# Patient Record
Sex: Female | Born: 1950 | Hispanic: Yes | Marital: Married | Smoking: Never smoker
Health system: Southern US, Community
[De-identification: ages and names within clinical notes are randomized; demographics above are authoritative.]

## PROBLEM LIST (undated history)

## (undated) DIAGNOSIS — Z87898 Personal history of other specified conditions: Secondary | ICD-10-CM

## (undated) DIAGNOSIS — J42 Unspecified chronic bronchitis: Secondary | ICD-10-CM

## (undated) DIAGNOSIS — I10 Essential (primary) hypertension: Secondary | ICD-10-CM

## (undated) HISTORY — DX: Personal history of other specified conditions: Z87.898

## (undated) HISTORY — DX: Unspecified chronic bronchitis: J42

---

## 2015-01-13 ENCOUNTER — Observation Stay (HOSPITAL_COMMUNITY)
Admission: EM | Admit: 2015-01-13 | Discharge: 2015-01-15 | Disposition: A | Discharge status: Home / Self Care | Payer: Self-pay | Attending: Internal Medicine | Admitting: Internal Medicine

## 2015-01-13 ENCOUNTER — Encounter (HOSPITAL_COMMUNITY): Payer: Self-pay | Admitting: *Deleted

## 2015-01-13 ENCOUNTER — Emergency Department (HOSPITAL_COMMUNITY): Payer: Self-pay

## 2015-01-13 DIAGNOSIS — R7989 Other specified abnormal findings of blood chemistry: Secondary | ICD-10-CM

## 2015-01-13 DIAGNOSIS — E279 Disorder of adrenal gland, unspecified: Secondary | ICD-10-CM

## 2015-01-13 DIAGNOSIS — R079 Chest pain, unspecified: Secondary | ICD-10-CM | POA: Insufficient documentation

## 2015-01-13 DIAGNOSIS — Z8249 Family history of ischemic heart disease and other diseases of the circulatory system: Secondary | ICD-10-CM | POA: Insufficient documentation

## 2015-01-13 DIAGNOSIS — J45901 Unspecified asthma with (acute) exacerbation: Principal | ICD-10-CM | POA: Insufficient documentation

## 2015-01-13 DIAGNOSIS — R0600 Dyspnea, unspecified: Secondary | ICD-10-CM | POA: Diagnosis present

## 2015-01-13 DIAGNOSIS — Z79899 Other long term (current) drug therapy: Secondary | ICD-10-CM | POA: Insufficient documentation

## 2015-01-13 DIAGNOSIS — E669 Obesity, unspecified: Secondary | ICD-10-CM | POA: Insufficient documentation

## 2015-01-13 DIAGNOSIS — R748 Abnormal levels of other serum enzymes: Secondary | ICD-10-CM | POA: Insufficient documentation

## 2015-01-13 DIAGNOSIS — R0602 Shortness of breath: Secondary | ICD-10-CM

## 2015-01-13 DIAGNOSIS — I10 Essential (primary) hypertension: Secondary | ICD-10-CM | POA: Insufficient documentation

## 2015-01-13 DIAGNOSIS — E278 Other specified disorders of adrenal gland: Secondary | ICD-10-CM | POA: Insufficient documentation

## 2015-01-13 DIAGNOSIS — R062 Wheezing: Secondary | ICD-10-CM | POA: Diagnosis present

## 2015-01-13 DIAGNOSIS — Z6841 Body Mass Index (BMI) 40.0 and over, adult: Secondary | ICD-10-CM | POA: Insufficient documentation

## 2015-01-13 DIAGNOSIS — I214 Non-ST elevation (NSTEMI) myocardial infarction: Secondary | ICD-10-CM | POA: Diagnosis present

## 2015-01-13 DIAGNOSIS — R778 Other specified abnormalities of plasma proteins: Secondary | ICD-10-CM

## 2015-01-13 HISTORY — DX: Essential (primary) hypertension: I10

## 2015-01-13 LAB — CBC WITH DIFFERENTIAL/PLATELET
BASOS PCT: 1 % (ref 0–1)
Basophils Absolute: 0.1 10*3/uL (ref 0.0–0.1)
EOS ABS: 0.1 10*3/uL (ref 0.0–0.7)
EOS PCT: 1 % (ref 0–5)
HEMATOCRIT: 37.9 % (ref 36.0–46.0)
Hemoglobin: 12.9 g/dL (ref 12.0–15.0)
LYMPHS ABS: 2.2 10*3/uL (ref 0.7–4.0)
LYMPHS PCT: 28 % (ref 12–46)
MCH: 31.8 pg (ref 26.0–34.0)
MCHC: 34 g/dL (ref 30.0–36.0)
MCV: 93.3 fL (ref 78.0–100.0)
Monocytes Absolute: 0.6 10*3/uL (ref 0.1–1.0)
Monocytes Relative: 8 % (ref 3–12)
NEUTROS ABS: 5 10*3/uL (ref 1.7–7.7)
NEUTROS PCT: 62 % (ref 43–77)
Platelets: 184 10*3/uL (ref 150–400)
RBC: 4.06 MIL/uL (ref 3.87–5.11)
RDW: 13.1 % (ref 11.5–15.5)
WBC: 7.9 10*3/uL (ref 4.0–10.5)

## 2015-01-13 LAB — COMPREHENSIVE METABOLIC PANEL
ALK PHOS: 63 U/L (ref 38–126)
ALT: 20 U/L (ref 14–54)
ANION GAP: 11 (ref 5–15)
AST: 25 U/L (ref 15–41)
Albumin: 3.2 g/dL — ABNORMAL LOW (ref 3.5–5.0)
BILIRUBIN TOTAL: 0.5 mg/dL (ref 0.3–1.2)
BUN: 13 mg/dL (ref 6–20)
CALCIUM: 8.9 mg/dL (ref 8.9–10.3)
CHLORIDE: 102 mmol/L (ref 101–111)
CO2: 27 mmol/L (ref 22–32)
CREATININE: 0.71 mg/dL (ref 0.44–1.00)
GFR calc Af Amer: >60 mL/min (ref >60)
GFR calc non Af Amer: >60 mL/min (ref >60)
GLUCOSE: 133 mg/dL — AB (ref 65–99)
POTASSIUM: 3.6 mmol/L (ref 3.5–5.1)
SODIUM: 140 mmol/L (ref 135–145)
TOTAL PROTEIN: 7.5 g/dL (ref 6.5–8.1)

## 2015-01-13 LAB — CBC
HCT: 38.5 % (ref 36.0–46.0)
Hemoglobin: 13.4 g/dL (ref 12.0–15.0)
MCH: 32.7 pg (ref 26.0–34.0)
MCHC: 34.8 g/dL (ref 30.0–36.0)
MCV: 93.9 fL (ref 78.0–100.0)
Platelets: 165 10*3/uL (ref 150–400)
RBC: 4.1 MIL/uL (ref 3.87–5.11)
RDW: 13.1 % (ref 11.5–15.5)
WBC: 6.3 10*3/uL (ref 4.0–10.5)

## 2015-01-13 LAB — HEPARIN LEVEL (UNFRACTIONATED)
HEPARIN UNFRACTIONATED: 0.11 [IU]/mL — AB (ref 0.30–0.70)
HEPARIN UNFRACTIONATED: 0.29 [IU]/mL — AB (ref 0.30–0.70)

## 2015-01-13 LAB — LIPID PANEL
CHOL/HDL RATIO: 3.7 ratio
CHOLESTEROL: 162 mg/dL (ref 0–200)
CHOLESTEROL: 165 mg/dL (ref 0–200)
HDL: 44 mg/dL (ref >40)
HDL: 44 mg/dL (ref >40)
LDL Cholesterol: 102 mg/dL — ABNORMAL HIGH (ref 0–99)
LDL Cholesterol: 106 mg/dL — ABNORMAL HIGH (ref 0–99)
TRIGLYCERIDES: 78 mg/dL (ref <150)
TRIGLYCERIDES: 76 mg/dL (ref <150)
Total CHOL/HDL Ratio: 3.8 RATIO
VLDL: 16 mg/dL (ref 0–40)
VLDL: 15 mg/dL (ref 0–40)

## 2015-01-13 LAB — I-STAT TROPONIN, ED
TROPONIN I, POC: 0.11 ng/mL — AB (ref 0.00–0.08)
TROPONIN I, POC: 0 ng/mL (ref 0.00–0.08)

## 2015-01-13 LAB — D-DIMER, QUANTITATIVE: D-Dimer, Quant: 1.8 ug/mL-FEU — ABNORMAL HIGH (ref 0.00–0.48)

## 2015-01-13 LAB — TSH
TSH: 6.609 u[IU]/mL — ABNORMAL HIGH (ref 0.350–4.500)
TSH: 4.818 u[IU]/mL — ABNORMAL HIGH (ref 0.350–4.500)
TSH: 5.517 u[IU]/mL — ABNORMAL HIGH (ref 0.350–4.500)

## 2015-01-13 LAB — TROPONIN I
Troponin I: <0.03 ng/mL (ref <0.031)
Troponin I: <0.03 ng/mL (ref <0.031)
Troponin I: <0.03 ng/mL (ref <0.031)

## 2015-01-13 LAB — BRAIN NATRIURETIC PEPTIDE
B NATRIURETIC PEPTIDE 5: 59.4 pg/mL (ref 0.0–100.0)
B NATRIURETIC PEPTIDE 5: 82.2 pg/mL (ref 0.0–100.0)

## 2015-01-13 MED ORDER — ASPIRIN 81 MG PO CHEW: 81.0000 mg | CHEWABLE_TABLET | Freq: Every day | ORAL | Status: DC

## 2015-01-13 MED ORDER — ALBUTEROL SULFATE HFA 108 (90 BASE) MCG/ACT IN AERS: 1.0000 | INHALATION_SPRAY | Freq: Four times a day (QID) | RESPIRATORY_TRACT | Status: DC | PRN

## 2015-01-13 MED ORDER — AZITHROMYCIN 250 MG PO TABS
250.0000 mg | ORAL_TABLET | Freq: Every day | ORAL | Status: DC
  Administered 2015-01-14 – 2015-01-15 (×2): 250 mg via ORAL
  Filled 2015-01-13 (×2): qty 1

## 2015-01-13 MED ORDER — ASPIRIN EC 81 MG PO TBEC: 81.0000 mg | DELAYED_RELEASE_TABLET | Freq: Every day | ORAL | Status: DC

## 2015-01-13 MED ORDER — IOHEXOL 350 MG/ML SOLN
100.0000 mL | Freq: Once | INTRAVENOUS | Status: AC | PRN
  Administered 2015-01-13: 100 mL via INTRAVENOUS

## 2015-01-13 MED ORDER — ACETAMINOPHEN 325 MG PO TABS: 650.0000 mg | ORAL_TABLET | Freq: Four times a day (QID) | ORAL | Status: DC | PRN

## 2015-01-13 MED ORDER — AZITHROMYCIN 500 MG PO TABS
500.0000 mg | ORAL_TABLET | Freq: Every day | ORAL | Status: DC
Start: 2015-01-13 — End: 2015-01-13

## 2015-01-13 MED ORDER — HEPARIN SODIUM (PORCINE) 5000 UNIT/ML IJ SOLN: 60.0000 [IU]/kg | Freq: Once | INTRAMUSCULAR | Status: DC

## 2015-01-13 MED ORDER — ACETAMINOPHEN 650 MG RE SUPP: 650.0000 mg | Freq: Four times a day (QID) | RECTAL | Status: DC | PRN

## 2015-01-13 MED ORDER — HEPARIN (PORCINE) IN NACL 100-0.45 UNIT/ML-% IJ SOLN
1000.0000 [IU]/h | INTRAMUSCULAR | Status: DC
  Administered 2015-01-13: 1000 [IU]/h via INTRAVENOUS
  Filled 2015-01-13: qty 250

## 2015-01-13 MED ORDER — IOHEXOL 350 MG/ML SOLN: 80.0000 mL | Freq: Once | INTRAVENOUS | Status: DC | PRN

## 2015-01-13 MED ORDER — HEPARIN BOLUS VIA INFUSION
2000.0000 [IU] | Freq: Once | INTRAVENOUS | Status: AC
  Administered 2015-01-13: 2000 [IU] via INTRAVENOUS
  Filled 2015-01-13: qty 2000

## 2015-01-13 MED ORDER — HEPARIN BOLUS VIA INFUSION
4000.0000 [IU] | Freq: Once | INTRAVENOUS | Status: AC
  Administered 2015-01-13: 4000 [IU] via INTRAVENOUS
  Filled 2015-01-13: qty 4000

## 2015-01-13 MED ORDER — ALBUTEROL SULFATE (2.5 MG/3ML) 0.083% IN NEBU: 2.5000 mg | INHALATION_SOLUTION | Freq: Four times a day (QID) | RESPIRATORY_TRACT | Status: DC | PRN

## 2015-01-13 MED ORDER — PREDNISONE 20 MG PO TABS: 40.0000 mg | ORAL_TABLET | Freq: Every day | ORAL | Status: DC

## 2015-01-13 MED ORDER — AZITHROMYCIN 500 MG PO TABS
500.0000 mg | ORAL_TABLET | Freq: Every day | ORAL | Status: AC
  Administered 2015-01-13: 500 mg via ORAL
  Filled 2015-01-13: qty 1

## 2015-01-13 MED ORDER — ASPIRIN 325 MG PO TABS
325.0000 mg | ORAL_TABLET | Freq: Every day | ORAL | Status: DC
  Administered 2015-01-13 – 2015-01-15 (×3): 325 mg via ORAL
  Filled 2015-01-13 (×3): qty 1

## 2015-01-13 MED ORDER — METOPROLOL TARTRATE 25 MG PO TABS
25.0000 mg | ORAL_TABLET | Freq: Two times a day (BID) | ORAL | Status: DC
  Administered 2015-01-13 – 2015-01-14 (×3): 25 mg via ORAL
  Filled 2015-01-13 (×5): qty 1

## 2015-01-13 MED ORDER — IPRATROPIUM-ALBUTEROL 0.5-2.5 (3) MG/3ML IN SOLN
3.0000 mL | Freq: Once | RESPIRATORY_TRACT | Status: AC
  Administered 2015-01-13: 3 mL via RESPIRATORY_TRACT
  Filled 2015-01-13: qty 3

## 2015-01-13 MED ORDER — GUAIFENESIN-DM 100-10 MG/5ML PO SYRP: 5.0000 mL | ORAL_SOLUTION | ORAL | Status: DC | PRN

## 2015-01-13 MED ORDER — SODIUM CHLORIDE 0.9 % IJ SOLN
3.0000 mL | Freq: Two times a day (BID) | INTRAMUSCULAR | Status: DC
Start: 2015-01-13 — End: 2015-01-15
  Administered 2015-01-14: 3 mL via INTRAVENOUS

## 2015-01-13 MED ORDER — HEPARIN BOLUS VIA INFUSION
1000.0000 [IU] | Freq: Once | INTRAVENOUS | Status: DC
  Filled 2015-01-13: qty 1000

## 2015-01-13 MED ORDER — HEPARIN BOLUS VIA INFUSION
1050.0000 [IU] | Freq: Once | INTRAVENOUS | Status: AC
Start: 2015-01-13 — End: 2015-01-13
  Administered 2015-01-13: 1050 [IU] via INTRAVENOUS
  Filled 2015-01-13: qty 1050

## 2015-01-13 MED ORDER — AZITHROMYCIN 250 MG PO TABS: 250.0000 mg | ORAL_TABLET | Freq: Every day | ORAL | Status: DC

## 2015-01-13 MED ORDER — IPRATROPIUM-ALBUTEROL 0.5-2.5 (3) MG/3ML IN SOLN
3.0000 mL | Freq: Four times a day (QID) | RESPIRATORY_TRACT | Status: AC
  Administered 2015-01-13 – 2015-01-14 (×3): 3 mL via RESPIRATORY_TRACT
  Filled 2015-01-13 (×4): qty 3

## 2015-01-13 MED ORDER — SODIUM CHLORIDE 0.9 % IJ SOLN
3.0000 mL | Freq: Two times a day (BID) | INTRAMUSCULAR | Status: DC
  Administered 2015-01-14 – 2015-01-15 (×2): 3 mL via INTRAVENOUS

## 2015-01-13 MED ORDER — ATORVASTATIN CALCIUM 40 MG PO TABS
40.0000 mg | ORAL_TABLET | Freq: Every day | ORAL | Status: DC
  Administered 2015-01-13 – 2015-01-14 (×2): 40 mg via ORAL
  Filled 2015-01-13 (×3): qty 1

## 2015-01-13 MED ORDER — HEPARIN (PORCINE) IN NACL 100-0.45 UNIT/ML-% IJ SOLN
1350.0000 [IU]/h | INTRAMUSCULAR | Status: DC
  Administered 2015-01-13: 1000 [IU]/h via INTRAVENOUS
  Administered 2015-01-14: 1350 [IU]/h via INTRAVENOUS
  Filled 2015-01-13 (×4): qty 250

## 2015-01-13 MED ORDER — PANTOPRAZOLE SODIUM 40 MG PO TBEC
80.0000 mg | DELAYED_RELEASE_TABLET | Freq: Every day | ORAL | Status: DC
  Administered 2015-01-13 – 2015-01-14 (×2): 80 mg via ORAL
  Filled 2015-01-13 (×2): qty 2

## 2015-01-13 MED ORDER — METHYLPREDNISOLONE SODIUM SUCC 125 MG IJ SOLR
125.0000 mg | Freq: Four times a day (QID) | INTRAMUSCULAR | Status: DC
  Administered 2015-01-13 – 2015-01-15 (×7): 125 mg via INTRAVENOUS
  Filled 2015-01-13 (×11): qty 2

## 2015-01-13 NOTE — H&P (Addendum)
[facility] History and Physical  Virginia Miles NFA:[id number] DOB: [birth year] DOA: 01/13/2015  Referring physician: ED PCP: Does not have a PCP here in the [place]. Visiting family from [place] for a few months.  Chief Complaint:  Shortness of breath for past 2 days  HPI:  64 year old obese female with history of? Chronic bronchitis, hypertension who came to the [place] about 2 weeks back from [place] to visit family presented to the ED with shortness of breath for the past 2 days. Patient reports having acute onset of shortness of breath worsened with activity and then became progressive making her dyspneic at rest associated with orthopnea. Reports having similar symptoms about 10 months back and was hospitalized in [place] and was told that she had bronchitis symptoms. Patient reports having some headache today but denies any blurred vision. She denies any chest pain or palpitation. Denies dizziness, fever, chills, nausea, vomiting, abdominal pain, bowel or urinary symptoms. Denies any leg swellings or pain. Reports her mother having a heart attack in her 40s and also had breast cancer. Denies having any cardiac workup done in the past. Course in the ED Patient had mildly elevated i-STAT troponin in the ED. BNP was normal. D-dimer was elevated as well. CBC and comprehensive metabolic panel were unremarkable except for glucose of 133 and 3.2. EKG done showed normal sinus rhythm without any ST-T changes. Hospitalist admission requested. Cardiology consulted who recommended cycling troponins, placing her on IV heparin and obtaining a 2-D echo.    Review of Systems:  As outlined in history of present illness, Toprol and review of systems otherwise unremarkable   Past Medical History  Diagnosis Date  . Hypertension       Reactivated disease/ bronchitis  History reviewed. No pertinent past surgical history. Social History:  reports that she has never smoked. She does not have any  smokeless tobacco history on file. She reports that she does not drink alcohol. Her drug history is not on file.  No Known Allergies  Family history Mother had heart attack in her 53s and also had breast cancer  Prior to Admission medications   Medication Sig Start Date End Date Taking? Authorizing Provider  albuterol (PROVENTIL HFA;VENTOLIN HFA) 108 (90 BASE) MCG/ACT inhaler Inhale 1-2 puffs into the lungs every 6 (six) hours as needed for wheezing or shortness of breath.   Yes Historical Provider, MD  omeprazole (PRILOSEC) 40 MG capsule Take 40 mg by mouth 2 (two) times daily.   Yes Historical Provider, MD     Physical Exam:  Filed Vitals:   01/13/15 0600 01/13/15 0630 01/13/15 0700 01/13/15 0730  BP: 128/69 142/68 145/59 134/67  Pulse: 80 68 60 83  Temp:      Resp: Height:      Weight:      SpO2: 100% 97% 96% 99%    Constitutional: Vital signs reviewed.  Elderly obese female in no acute distress HEENT: no pallor, no icterus, moist oral mucosa, no cervical lymphadenopathy, no JVD Cardiovascular: RRR, S1 normal, S2 normal, no MRG Chest: CTAB, no wheezes, rales, or rhonchi Abdominal: Soft. Non-tender, non-distended, bowel sounds are normal,  Ext: warm, no edema Neurological: Alert and oriented, nonfocal  Labs on Admission:  Basic Metabolic Panel:  Recent Labs Lab 01/13/15 0051  NA 140  K 3.6  CL 102  CO2 27  GLUCOSE 133*  BUN 13  CREATININE 0.71  CALCIUM 8.9   Liver Function Tests:  Recent Labs Lab 01/13/15 0051  AST 25  ALT 20  ALKPHOS 63  BILITOT 0.5  PROT 7.5  ALBUMIN 3.2*   No results for input(s): LIPASE, AMYLASE in the last 168 hours. No results for input(s): AMMONIA in the last 168 hours. CBC:  Recent Labs Lab 01/13/15 0051  WBC 7.9  NEUTROABS 5.0  HGB 12.9  HCT 37.9  MCV 93.3  PLT 184   Cardiac Enzymes:  Recent Labs Lab 01/13/15 0240  TROPONINI <0.03   BNP: Invalid input(s): POCBNP CBG: No results for input(s):  GLUCAP in the last 168 hours.  Radiological Exams on Admission: Dg Chest 2 View  01/13/2015   CLINICAL DATA:  Shortness of breath and cough  EXAM: CHEST  2 VIEW  COMPARISON:  None.  FINDINGS: There is no edema or consolidation. Heart size and pulmonary vascularity are normal. No adenopathy. There is degenerative change in the thoracic spine.  IMPRESSION: No edema or consolidation.   Electronically Signed   By: Bretta Bang III M.D.   On: 01/13/2015 02:38   Ct Angio Chest Pe W/cm &/or Wo Cm  01/13/2015   CLINICAL DATA:  Shortness of breath with exertion. History of hypertension. Assess for pulmonary embolism.  EXAM: CT ANGIOGRAPHY CHEST WITH CONTRAST  TECHNIQUE: Multidetector CT imaging of the chest was performed using the standard protocol during bolus administration of intravenous contrast. Multiplanar CT image reconstructions and MIPs were obtained to evaluate the vascular anatomy.  CONTRAST:  OMNIPAQUE IOHEXOL 350 MG/ML SOLN  COMPARISON:  Chest radiograph January 13, 2015 at 1:29 a.m.  FINDINGS: PULMONARY ARTERY: Adequate contrast opacification of the pulmonary artery's. Main pulmonary artery is not enlarged. No pulmonary arterial filling defects to the level of the subsegmental branches.  MEDIASTINUM: Heart is mildly enlarged, no RIGHT heart strain. No pericardial effusions. Delete the Thoracic aorta is normal course and caliber, 2 vessel arch is a normal variant, mild calcific atherosclerosis. RIGHT hilar lymphadenopathy measures up to 12 mm short axis, smaller LEFT hilar lymph nodes. LEFT peritracheal 11 mm short axis lymph node.  LUNGS: Tracheobronchial tree is patent, no pneumothorax. Pulmonary venous congestion. No pleural effusions, focal consolidations, pulmonary nodules or masses. Mild heterogeneous lung attenuation.  SOFT TISSUES AND OSSEOUS STRUCTURES: 16 x 15 mm LEFT adrenal nodule, 41 Hounsfield units. Visualized soft tissues and included osseous structures appear normal.  Review of the  MIP images confirms the above findings.  IMPRESSION: No acute pulmonary embolism.  Mild cardiomegaly and pulmonary venous congestion.  Heterogeneous lung attenuation can be seen with small airway disease or pulmonary edema without focal consolidation.  15 x 16 mm LEFT adrenal nodule: Recommend follow-up CT versus MRI and 1 year.   Electronically Signed   By: Awilda Metro M.D.   On: 01/13/2015 05:48    EKG: Normal sinus rhythm at 91, no ST-T changes  Assessment/Plan Principal Problem:   NSTEMI (non-ST elevated myocardial infarction) -Patient is stable to be admitted on telemetry. Patient has mildly elevated troponin in the ED. Started on IV heparin drip. Will order full dose aspirin. Check lipid panel and 2-D echo. Will place her on low-dose metoprolol. Cycle troponins. Based on history and exam my suspicion of ACS is quite low. -Cardiology following.  Active Problems:   Dyspnea secondary to Reactive airway disease with acute exacerbation Patient reports history of bronchitis and having similar symptoms about 10 months back in [place]. Reportedly was wheezy upon presentation. Also has nonproductive cough. Possibly has acute bronchitis symptoms. No signs of volume overload. Check BNP. -I will place  her on a course of Z-Pak, or prednisone and when necessary albuterol nebs.      Essential hypertension On Benicar at home. Does not remember the dose. Monitor on beta blocker for now.  Incidental finding of left adrenal nodule 1516 mm on CT scan No Constitutional  symptoms. Recommend follow-up CT versus MRI in one year.      Obesity Needs counseling on diet monitoring and weight loss. Check lipid panel and A1c.    Diet:cardiac  DVT prophylaxis: IV heparin   Code Status: Full code Family Communication: discussed with patient's son and husband bedside Disposition Plan: Admit to stepdown  Eddie North [facility] Pager [phone]  Total time spent on admission :50  minutes  If 7PM-7AM, please contact night-coverage [email or web address] Password [facility] 01/13/2015, 7:44 AM

## 2015-01-13 NOTE — Progress Notes (Signed)
ANTICOAGULATION CONSULT NOTE - Follow-up  Pharmacy Consult for heparin Indication: chest pain/ACS  No Known Allergies  Patient Measurements: Height: 5' 0.24" (153 cm) Weight: 224 lb (101.606 kg) IBW/kg (Calculated) : 46.04 Heparin Dosing Weight: 70.8 kg  Vital Signs: BP: 134/70 mmHg (08/08 1300) Pulse Rate: 71 (08/08 1300)  Labs:  Recent Labs  01/13/15 0051 01/13/15 0240 01/13/15 1223  HGB 12.9  --  13.4  HCT 37.9  --  38.5  PLT 184  --  165  HEPARINUNFRC  --   --  0.11*  CREATININE 0.71  --   --   TROPONINI  --  <0.03  --     Estimated Creatinine Clearance: 77.5 mL/min (by C-G formula based on Cr of 0.71).  Assessment: 64 yo F in ED with CC of SOB. She continues on IV heparin. CT negative for PE. Initial heparin level is subtherapeutic. No bleeding noted.   Goal of Therapy:  Heparin level 0.3-0.7 units/ml Monitor platelets by anticoagulation protocol: Yes   Plan:  - Heparin bolus 2000 units IV x 1 - Increase heparin gtt to 1200 units/hr - Check a 6 hour heparin level - Continue daily heparin level and CBC  Lysle Pearl, PharmD, BCPS Pager # [phone] 01/13/2015 1:44 PM

## 2015-01-13 NOTE — ED Provider Notes (Signed)
CSN: [id number]     Arrival date & time 01/13/15  0017 History   First MD Initiated Contact with Patient 01/13/15 [phone]     Chief Complaint  Patient presents with  . Shortness of Breath     (Consider location/radiation/quality/duration/timing/severity/associated sxs/prior Treatment) HPI Comments: Patient is a 64 year old female with a past medical history of hypertension who presents with SOB that started earlier this morning. Symptoms started gradually and remained constant since the onset. The SOB is present at rest and is worsened with exertion. Patient reports associated epigastric "fullness" that started around the same time. She also reports associated non productive cough. She denies chest pain, fever, leg swelling. No recent travel/surgery/immobilization, exogenous estrogen, previous PE/DVT.    Past Medical History  Diagnosis Date  . Hypertension    History reviewed. No pertinent past surgical history. No family history on file. History  Substance Use Topics  . Smoking status: Never Smoker   . Smokeless tobacco: Not on file  . Alcohol Use: No   OB History    No data available     Review of Systems  Respiratory: Positive for cough and shortness of breath.   All other systems reviewed and are negative.     Allergies  Review of patient's allergies indicates no known allergies.  Home Medications   Prior to Admission medications   Medication Sig Start Date End Date Taking? Authorizing Provider  albuterol (PROVENTIL HFA;VENTOLIN HFA) 108 (90 BASE) MCG/ACT inhaler Inhale 1-2 puffs into the lungs every 6 (six) hours as needed for wheezing or shortness of breath.   Yes Historical Provider, MD  omeprazole (PRILOSEC) 40 MG capsule Take 40 mg by mouth 2 (two) times daily.   Yes Historical Provider, MD   BP 123/47 mmHg  Pulse 81  Temp(Src) 99.2 F (37.3 C)  Resp 20  SpO2 99% Physical Exam  Constitutional: She is oriented to person, place, and time. She appears  well-developed and well-nourished. No distress.  HENT:  Head: Normocephalic and atraumatic.  Eyes: Conjunctivae and EOM are normal.  Neck: Normal range of motion.  Cardiovascular: Normal rate and regular rhythm.  Exam reveals no gallop and no friction rub.   No murmur heard. No lower extremity swelling or calf tenderness to palpation.   Pulmonary/Chest: Effort normal and breath sounds normal. She has no wheezes. She has no rales. She exhibits no tenderness.  Mild expiratory wheezes at bilateral apices  Abdominal: Soft. She exhibits no distension. There is no tenderness. There is no rebound.  Musculoskeletal: Normal range of motion.  Neurological: She is alert and oriented to person, place, and time. Coordination normal.  Speech is goal-oriented. Moves limbs without ataxia.   Skin: Skin is warm and dry.  Psychiatric: She has a normal mood and affect. Her behavior is normal.  Nursing note and vitals reviewed.   ED Course  Procedures (including critical care time)  CRITICAL CARE Performed by: Emilia Beck   Total critical care time: 35 min  Critical care time was exclusive of separately billable procedures and treating other patients.  Critical care was necessary to treat or prevent imminent or life-threatening deterioration.  Critical care was time spent personally by me on the following activities: development of treatment plan with patient and/or surrogate as well as nursing, discussions with consultants, evaluation of patient's response to treatment, examination of patient, obtaining history from patient or surrogate, ordering and performing treatments and interventions, ordering and review of laboratory studies, ordering and review of radiographic studies,  pulse oximetry and re-evaluation of patient's condition.   Labs Review Labs Reviewed  COMPREHENSIVE METABOLIC PANEL - Abnormal; Notable for the following:    Glucose, Bld 133 (*)    Albumin 3.2 (*)    All other  components within normal limits  D-DIMER, QUANTITATIVE (NOT AT [facility]) - Abnormal; Notable for the following:    D-Dimer, Quant 1.80 (*)    All other components within normal limits  HEPARIN LEVEL (UNFRACTIONATED) - Abnormal; Notable for the following:    Heparin Unfractionated 0.11 (*)    All other components within normal limits  LIPID PANEL - Abnormal; Notable for the following:    LDL Cholesterol 102 (*)    All other components within normal limits  TSH - Abnormal; Notable for the following:    TSH 6.609 (*)    All other components within normal limits  TSH - Abnormal; Notable for the following:    TSH 5.517 (*)    All other components within normal limits  LIPID PANEL - Abnormal; Notable for the following:    LDL Cholesterol 106 (*)    All other components within normal limits  TSH - Abnormal; Notable for the following:    TSH 4.818 (*)    All other components within normal limits  I-STAT TROPOININ, ED - Abnormal; Notable for the following:    Troponin i, poc 0.11 (*)    All other components within normal limits  CBC WITH DIFFERENTIAL/PLATELET  BRAIN NATRIURETIC PEPTIDE  TROPONIN I  CBC  TROPONIN I  BRAIN NATRIURETIC PEPTIDE  HEMOGLOBIN A1C  HEPARIN LEVEL (UNFRACTIONATED)  TROPONIN I  TROPONIN I  TROPONIN I  HEPARIN LEVEL (UNFRACTIONATED)  CBC  BASIC METABOLIC PANEL  PROTIME-INR  I-STAT TROPOININ, ED    Imaging Review No results found.   EKG Interpretation   Date/Time:  Monday January 13 2015 00:24:17 EDT Ventricular Rate:  91 PR Interval:  158 QRS Duration: 88 QT Interval:  362 QTC Calculation: 445 R Axis:   65 Text Interpretation:  Normal sinus rhythm Normal ECG No prior for  comparison Confirmed by Gwendolyn Grant  MD, BLAIR ([id number]) on 01/13/2015 2:22:15 AM      MDM   Final diagnoses:  SOB (shortness of breath)  Elevated troponin  NSTEMI (non-ST elevated myocardial infarction)    2:31 AM Patient's vitals stable and patient afebrile. EKG shows no acute  changes. Troponin pending. Chest xray pending. Patient will have duoneb for mild wheezing to see if this improves her symptoms.  Patient's troponin mildly elevated. Chest xray unremarkable. EKG shows no acute changes. Dr. Gwendolyn Grant saw the patient and spoke to the Cardiology fellow who will see the patient.   Emilia Beck, PA-C 01/13/15 2031  Elwin Mocha, MD 01/13/15 2352

## 2015-01-13 NOTE — Progress Notes (Addendum)
Subjective: No chest pain only upper abd discomfort.    Objective: Vital signs in last 24 hours: Temp:  [99.2 F (37.3 C)] 99.2 F (37.3 C) (08/08 0026) Pulse Rate:  [57-92] 68 (08/08 1400) Resp:  [14-23] 17 (08/08 1400) BP: (117-153)/(47-75) 117/56 mmHg (08/08 1400) SpO2:  [95 %-100 %] 100 % (08/08 1400) Weight:  [224 lb (101.606 kg)] 224 lb (101.606 kg) (08/08 0349) Weight change:    Intake/Output from previous day:   Intake/Output this shift:    PE: General:Pleasant affect, NAD Skin:Warm and dry, brisk capillary refill HEENT:normocephalic, sclera clear, mucus membranes moist Heart:S1S2 RRR without murmur, gallup, rub or click Lungs: without rales, rhonchi, + wheezes GNF:AOZH, non tender, + BS, do not palpate liver spleen or masses Ext:no lower ext edema, 2+ pedal pulses, 2+ radial pulses Neuro:alert and oriented X 3, MAE, follows commands, + facial symmetry  tele:  SR   Lab Results:  Recent Labs  01/13/15 0051 01/13/15 1223  WBC 7.9 6.3  HGB 12.9 13.4  HCT 37.9 38.5  PLT 184 165   BMET  Recent Labs  01/13/15 0051  NA 140  K 3.6  CL 102  CO2 27  GLUCOSE 133*  BUN 13  CREATININE 0.71  CALCIUM 8.9    Recent Labs  01/13/15 0240  TROPONINI <0.03    Lab Results  Component Value Date   CHOL 162 01/13/2015   HDL 44 01/13/2015   LDLCALC 102* 01/13/2015   TRIG 78 01/13/2015   CHOLHDL 3.7 01/13/2015   No results found for: HGBA1C   Lab Results  Component Value Date   TSH 6.609* 01/13/2015    Hepatic Function Panel  Recent Labs  01/13/15 0051  PROT 7.5  ALBUMIN 3.2*  AST 25  ALT 20  ALKPHOS 63  BILITOT 0.5    Recent Labs  01/13/15 0838  CHOL 162   No results for input(s): PROTIME in the last 72 hours.     Studies/Results: Dg Chest 2 View  01/13/2015   CLINICAL DATA:  Shortness of breath and cough  EXAM: CHEST  2 VIEW  COMPARISON:  None.  FINDINGS: There is no edema or consolidation. Heart size and pulmonary  vascularity are normal. No adenopathy. There is degenerative change in the thoracic spine.  IMPRESSION: No edema or consolidation.   Electronically Signed   By: Bretta Bang III M.D.   On: 01/13/2015 02:38   Ct Angio Chest Pe W/cm &/or Wo Cm  01/13/2015   CLINICAL DATA:  Shortness of breath with exertion. History of hypertension. Assess for pulmonary embolism.  EXAM: CT ANGIOGRAPHY CHEST WITH CONTRAST  TECHNIQUE: Multidetector CT imaging of the chest was performed using the standard protocol during bolus administration of intravenous contrast. Multiplanar CT image reconstructions and MIPs were obtained to evaluate the vascular anatomy.  CONTRAST:  OMNIPAQUE IOHEXOL 350 MG/ML SOLN  COMPARISON:  Chest radiograph January 13, 2015 at 1:29 a.m.  FINDINGS: PULMONARY ARTERY: Adequate contrast opacification of the pulmonary artery's. Main pulmonary artery is not enlarged. No pulmonary arterial filling defects to the level of the subsegmental branches.  MEDIASTINUM: Heart is mildly enlarged, no RIGHT heart strain. No pericardial effusions. Delete the Thoracic aorta is normal course and caliber, 2 vessel arch is a normal variant, mild calcific atherosclerosis. RIGHT hilar lymphadenopathy measures up to 12 mm short axis, smaller LEFT hilar lymph nodes. LEFT peritracheal 11 mm short axis lymph node.  LUNGS: Tracheobronchial tree is patent, no  pneumothorax. Pulmonary venous congestion. No pleural effusions, focal consolidations, pulmonary nodules or masses. Mild heterogeneous lung attenuation.  SOFT TISSUES AND OSSEOUS STRUCTURES: 16 x 15 mm LEFT adrenal nodule, 41 Hounsfield units. Visualized soft tissues and included osseous structures appear normal.  Review of the MIP images confirms the above findings.  IMPRESSION: No acute pulmonary embolism.  Mild cardiomegaly and pulmonary venous congestion.  Heterogeneous lung attenuation can be seen with small airway disease or pulmonary edema without focal consolidation.  15  x 16 mm LEFT adrenal nodule: Recommend follow-up CT versus MRI and 1 year.   Electronically Signed   By: Awilda Metro M.D.   On: 01/13/2015 05:48    Medications: I have reviewed the patient's current medications. Scheduled Meds: . aspirin  325 mg Oral Daily   Continuous Infusions: . heparin 1,200 Units/hr (01/13/15 1344)   PRN Meds:.  Assessment/Plan: Principal Problem:   NSTEMI (non-ST elevated myocardial infarction) Active Problems:   Wheezing   Reactive airway disease with acute exacerbation   Dyspnea   Essential hypertension   Obesity  Only one troponin is elevated, ? Erroneous- follow up not yet done.  On IV heparin, plan for echo- not yet done.  Her pain is upper abd paiin .  Neg PE on CTA- no mention of calcified coronary arteries on CT. Pt's family in room to interpret .    LOS: 0 days   Time spent with pt. :10 minutes. [facility]  Nurse Practitioner Certified Pager [phone] or after 5pm and on weekends call [phone] 01/13/2015, 3:09 PM  Patient seen on  afternoon rounds.  Currently she is pain-free and comfortable.  Exam reveals clear lungs.  Heart reveals no murmur gallop rub or click.  Follow-up troponins are pending.  EKG personally reviewed.  Shows no ischemic changes.

## 2015-01-13 NOTE — ED Notes (Signed)
The pt is c/o sob since yesterday no pain anywhere.  Non-productive cough no sob at present .  Just feeling tired

## 2015-01-13 NOTE — H&P (Signed)
CARDIOLOGY INPATIENT HISTORY AND PHYSICAL EXAMINATION NOTE  Patient ID: Virginia Miles MRN: [id number], DOB/AGE: 1951-03-09   Admit date: 01/13/2015   Primary Physician: No PCP Per Patient Primary Cardiologist: none  Reason for admission: elevated troponin with SOB  HPI: This is a 64 y.o.Hispanic/spanish speaking female with no known history of CAD and risk factors (hypertension), significant family history of CAD who presented with SOB. Patient was in his usual state of health until 2 days ago when she started having swelling in the legs and has started having orthopnea. She also has DOE. She denied any chest pain. She can walk 10 feet and then gets SOB. She has been having some palpitations since the last 2 months. She lives with her husband. She was born in [place]. She moved from outside [place] 2 weeks ago. She is visiting her son.   She is being admitted for elevated troponin. In the ED, she was found to have elevated D-dimer and a CTA was performed which is pending at the moment of this history.   Problem List: Past Medical History  Diagnosis Date  . Hypertension     History reviewed. No pertinent past surgical history.   Allergies: No Known Allergies   Home Medications Current Facility-Administered Medications  Medication Dose Route Frequency Provider Last Rate Last Dose  . heparin ADULT infusion 100 units/mL (25000 units/250 mL)  1,000 Units/hr Intravenous Continuous Herby Abraham, [facility]   Stopped at 01/13/15 4098   Current Outpatient Prescriptions  Medication Sig Dispense Refill  . albuterol (PROVENTIL HFA;VENTOLIN HFA) 108 (90 BASE) MCG/ACT inhaler Inhale 1-2 puffs into the lungs every 6 (six) hours as needed for wheezing or shortness of breath.    Marland Kitchen omeprazole (PRILOSEC) 40 MG capsule Take 40 mg by mouth 2 (two) times daily.       No family history on file.   History   Social History  . Marital Status: Married    Spouse Name: N/A  . Number of Children: N/A  .  Years of Education: N/A   Occupational History  . Not on file.   Social History Main Topics  . Smoking status: Never Smoker   . Smokeless tobacco: Not on file  . Alcohol Use: No  . Drug Use: Not on file  . Sexual Activity: Not on file   Other Topics Concern  . Not on file   Social History Narrative  . No narrative on file     Review of Systems: General: negative for chills, fever, night sweats or weight changes.  Cardiovascular: dyspnea on exertion, dyspnea and palpitations, orthopnea and PND Dermatological: negative for rash Respiratory: dry cough  Urologic: negative for hematuria Abdominal: negative for nausea, vomiting, diarrhea, bright red blood per rectum, melena, or hematemesis Neurologic: negative for visual changes, syncope, or dizziness Endocrine: no diabetes, no hypothyroidism Immunological: no lymph adenopathy Psych: non homicidal/suicidal  Physical Exam: Vitals: BP 124/66 mmHg  Pulse 63  Temp(Src) 99.2 F (37.3 C)  Resp 15  Ht 5' 0.24" (1.53 m)  Wt 101.606 kg (224 lb)  BMI 43.40 kg/m2  SpO2 97% General: not in acute distress Neck: JVP flat, neck supple Heart: regular rate and rhythm, S1, S2, no murmurs  Lungs: wheezing GI: non tender, non distended, bowel sounds present Extremities: no edema Neuro: AAO x 3  Psych: normal affect, no anxiety   Labs:   Results for orders placed or performed during the hospital encounter of 01/13/15 (from the past 24 hour(s))  CBC with Differential  Status: None   Collection Time: 01/13/15 12:51 AM  Result Value Ref Range   WBC 7.9 4.0 - 10.5 K/uL   RBC 4.06 3.87 - 5.11 MIL/uL   Hemoglobin 12.9 12.0 - 15.0 g/dL   HCT 16.1 09.6 - 04.5 %   MCV 93.3 78.0 - 100.0 fL   MCH 31.8 26.0 - 34.0 pg   MCHC 34.0 30.0 - 36.0 g/dL   RDW 40.9 81.1 - 91.4 %   Platelets 184 150 - 400 K/uL   Neutrophils Relative % 62 43 - 77 %   Neutro Abs 5.0 1.7 - 7.7 K/uL   Lymphocytes Relative 28 12 - 46 %   Lymphs Abs 2.2 0.7 - 4.0  K/uL   Monocytes Relative 8 3 - 12 %   Monocytes Absolute 0.6 0.1 - 1.0 K/uL   Eosinophils Relative 1 0 - 5 %   Eosinophils Absolute 0.1 0.0 - 0.7 K/uL   Basophils Relative 1 0 - 1 %   Basophils Absolute 0.1 0.0 - 0.1 K/uL  Comprehensive metabolic panel     Status: Abnormal   Collection Time: 01/13/15 12:51 AM  Result Value Ref Range   Sodium 140 135 - 145 mmol/L   Potassium 3.6 3.5 - 5.1 mmol/L   Chloride 102 101 - 111 mmol/L   CO2 27 22 - 32 mmol/L   Glucose, Bld 133 (H) 65 - 99 mg/dL   BUN 13 6 - 20 mg/dL   Creatinine, Ser 7.82 0.44 - 1.00 mg/dL   Calcium 8.9 8.9 - 95.6 mg/dL   Total Protein 7.5 6.5 - 8.1 g/dL   Albumin 3.2 (L) 3.5 - 5.0 g/dL   AST 25 15 - 41 U/L   ALT 20 14 - 54 U/L   Alkaline Phosphatase 63 38 - 126 U/L   Total Bilirubin 0.5 0.3 - 1.2 mg/dL   GFR calc non Af Amer >60 >60 mL/min   GFR calc Af Amer >60 >60 mL/min   Anion gap 11 5 - 15  Brain natriuretic peptide     Status: None   Collection Time: 01/13/15 12:51 AM  Result Value Ref Range   B Natriuretic Peptide 59.4 0.0 - 100.0 pg/mL  D-dimer, quantitative (not at [facility])     Status: Abnormal   Collection Time: 01/13/15 12:51 AM  Result Value Ref Range   D-Dimer, Quant 1.80 (H) 0.00 - 0.48 ug/mL-FEU  Troponin I     Status: None   Collection Time: 01/13/15  2:40 AM  Result Value Ref Range   Troponin I <0.03 <0.031 ng/mL  I-stat troponin, ED     Status: Abnormal   Collection Time: 01/13/15  2:59 AM  Result Value Ref Range   Troponin i, poc 0.11 (HH) 0.00 - 0.08 ng/mL   Comment NOTIFIED PHYSICIAN    Comment 3             Radiology/Studies: Dg Chest 2 View  01/13/2015   CLINICAL DATA:  Shortness of breath and cough  EXAM: CHEST  2 VIEW  COMPARISON:  None.  FINDINGS: There is no edema or consolidation. Heart size and pulmonary vascularity are normal. No adenopathy. There is degenerative change in the thoracic spine.  IMPRESSION: No edema or consolidation.   Electronically Signed   By: Bretta Bang III  M.D.   On: 01/13/2015 02:38   Ct Angio Chest Pe W/cm &/or Wo Cm  01/13/2015   CLINICAL DATA:  Shortness of breath with exertion. History of hypertension. Assess for  pulmonary embolism.  EXAM: CT ANGIOGRAPHY CHEST WITH CONTRAST  TECHNIQUE: Multidetector CT imaging of the chest was performed using the standard protocol during bolus administration of intravenous contrast. Multiplanar CT image reconstructions and MIPs were obtained to evaluate the vascular anatomy.  CONTRAST:  OMNIPAQUE IOHEXOL 350 MG/ML SOLN  COMPARISON:  Chest radiograph January 13, 2015 at 1:29 a.m.  FINDINGS: PULMONARY ARTERY: Adequate contrast opacification of the pulmonary artery's. Main pulmonary artery is not enlarged. No pulmonary arterial filling defects to the level of the subsegmental branches.  MEDIASTINUM: Heart is mildly enlarged, no RIGHT heart strain. No pericardial effusions. Delete the Thoracic aorta is normal course and caliber, 2 vessel arch is a normal variant, mild calcific atherosclerosis. RIGHT hilar lymphadenopathy measures up to 12 mm short axis, smaller LEFT hilar lymph nodes. LEFT peritracheal 11 mm short axis lymph node.  LUNGS: Tracheobronchial tree is patent, no pneumothorax. Pulmonary venous congestion. No pleural effusions, focal consolidations, pulmonary nodules or masses. Mild heterogeneous lung attenuation.  SOFT TISSUES AND OSSEOUS STRUCTURES: 16 x 15 mm LEFT adrenal nodule, 41 Hounsfield units. Visualized soft tissues and included osseous structures appear normal.  Review of the MIP images confirms the above findings.  IMPRESSION: No acute pulmonary embolism.  Mild cardiomegaly and pulmonary venous congestion.  Heterogeneous lung attenuation can be seen with small airway disease or pulmonary edema without focal consolidation.  15 x 16 mm LEFT adrenal nodule: Recommend follow-up CT versus MRI and 1 year.   Electronically Signed   By: Awilda Metro M.D.   On: 01/13/2015 05:48    EKG: normal sinus rhythm,  no ST/T wave changes Echo: none available  Cardiac cath: NA  Medical decision making:  Discussed care with the patient Discussed care with the physician on the phone Reviewed labs and imaging personally Reviewed prior records  ASSESSMENT AND PLAN:  This is a 64 y.o. female 64 y.o.Hispanic/spanish speaking female with no known history of CAD and risk factors (hypertension), significant family history of CAD who presented with SOB, dry cough and recent bronchitis attack who is [place] from [place]. She has history of bronchitis almost every year since the last 2-3 years.    Active Problems:   NSTEMI (non-ST elevated myocardial infarction)   Wheezing   Reactive airway disease with acute exacerbation   Dyspnea  Recs: IV heparin drip Cycle troponin Obtain echocardiogram Treat mild asthma exacerbation with steroids, nebs and oxygen Azithromycin dose pack    Signed, Joellyn Rued, MD MS 01/13/2015, 5:54 AM

## 2015-01-13 NOTE — Progress Notes (Signed)
ANTICOAGULATION CONSULT NOTE - Initial Consult  Pharmacy Consult for heparin Indication: chest pain/ACS  No Known Allergies  Patient Measurements: Height: 5' 0.24" (153 cm) Weight: 224 lb (101.606 kg) IBW/kg (Calculated) : 46.04 Heparin Dosing Weight: 70.8 kg  Vital Signs: Temp: 99.2 F (37.3 C) (08/08 0026) BP: 139/71 mmHg (08/08 0330) Pulse Rate: 76 (08/08 0330)  Labs:  Recent Labs  01/13/15 0051  HGB 12.9  HCT 37.9  PLT 184  CREATININE 0.71    Estimated Creatinine Clearance: 77.5 mL/min (by C-G formula based on Cr of 0.71).   Medical History: Past Medical History  Diagnosis Date  . Hypertension      Assessment: 64 yo F in ED with CC of SOB.  Pharmacy consulted to dose heparin for ACS/STEMI.  EKG with no acute changes. First troponin 0.11.  CBC WNL.  Per pt report ht = 153 cm and wt 224 lbs.  Creat 0.71   Goal of Therapy:  Heparin level 0.3-0.7 units/ml Monitor platelets by anticoagulation protocol: Yes   Plan:  Give 4000 units bolus x 1 Start heparin infusion at 1000 units/hr Check anti-Xa level in 6 hours and daily while on heparin Continue to monitor H&H and platelets  Herby Abraham, Pharm.D. [phone] 01/13/2015 3:55 AM

## 2015-01-13 NOTE — ED Notes (Signed)
Patient denies pain and is resting comfortably.  

## 2015-01-13 NOTE — ED Notes (Signed)
MD at bedside. 

## 2015-01-13 NOTE — ED Notes (Signed)
Patient transported to CT 

## 2015-01-13 NOTE — Progress Notes (Signed)
ANTICOAGULATION CONSULT NOTE - Follow-up  Pharmacy Consult for heparin Indication: chest pain/ACS  No Known Allergies  Patient Measurements: Height:  (149.9 cm) Weight: 221 lb 12.5 oz (100.6 kg) IBW/kg (Calculated) : 43.2 Heparin Dosing Weight: 70.8 kg  Vital Signs: Temp: 98.2 F (36.8 C) (08/08 2054) Temp Source: Oral (08/08 2054) BP: 135/63 mmHg (08/08 2054) Pulse Rate: 67 (08/08 2054)  Labs:  Recent Labs  01/13/15 0051 01/13/15 0240 01/13/15 1223 01/13/15 1631 01/13/15 2045  HGB 12.9  --  13.4  --   --   HCT 37.9  --  38.5  --   --   PLT 184  --  165  --   --   HEPARINUNFRC  --   --  0.11*  --  0.29*  CREATININE 0.71  --   --   --   --   TROPONINI  --  <0.03  --  <0.03  --     Estimated Creatinine Clearance: 75.2 mL/min (by C-G formula based on Cr of 0.71).  Assessment: 64 yo F with CC of SOB. She continues on IV heparin. CT negative for PE. ECHO is pending. Heparin level is still subtherapeutic at 0.29. No bleeding noted.   Goal of Therapy:  Heparin level 0.3-0.7 units/ml Monitor platelets by anticoagulation protocol: Yes   Plan:  - Heparin bolus 1050 units IV x 1 - Increase heparin gtt to 1350 units/hr - Check a 6 hour heparin level - Continue daily heparin level and CBC  Juanita Craver, PharmD, BCPS Clinical Pharmacist [phone]

## 2015-01-14 ENCOUNTER — Ambulatory Visit (HOSPITAL_COMMUNITY): Payer: Self-pay | Attending: Internal Medicine

## 2015-01-14 DIAGNOSIS — I351 Nonrheumatic aortic (valve) insufficiency: Secondary | ICD-10-CM | POA: Insufficient documentation

## 2015-01-14 DIAGNOSIS — R0600 Dyspnea, unspecified: Secondary | ICD-10-CM

## 2015-01-14 DIAGNOSIS — I10 Essential (primary) hypertension: Secondary | ICD-10-CM | POA: Insufficient documentation

## 2015-01-14 DIAGNOSIS — R778 Other specified abnormalities of plasma proteins: Secondary | ICD-10-CM | POA: Insufficient documentation

## 2015-01-14 DIAGNOSIS — I071 Rheumatic tricuspid insufficiency: Secondary | ICD-10-CM | POA: Insufficient documentation

## 2015-01-14 DIAGNOSIS — R7989 Other specified abnormal findings of blood chemistry: Secondary | ICD-10-CM

## 2015-01-14 DIAGNOSIS — E669 Obesity, unspecified: Secondary | ICD-10-CM

## 2015-01-14 LAB — BASIC METABOLIC PANEL
ANION GAP: 8 (ref 5–15)
BUN: 14 mg/dL (ref 6–20)
CO2: 25 mmol/L (ref 22–32)
Calcium: 8.6 mg/dL — ABNORMAL LOW (ref 8.9–10.3)
Chloride: 104 mmol/L (ref 101–111)
Creatinine, Ser: 0.73 mg/dL (ref 0.44–1.00)
GFR calc non Af Amer: >60 mL/min (ref >60)
GLUCOSE: 177 mg/dL — AB (ref 65–99)
POTASSIUM: 3.6 mmol/L (ref 3.5–5.1)
Sodium: 137 mmol/L (ref 135–145)

## 2015-01-14 LAB — CBC
HEMATOCRIT: 37.1 % (ref 36.0–46.0)
Hemoglobin: 13.1 g/dL (ref 12.0–15.0)
MCH: 32.7 pg (ref 26.0–34.0)
MCHC: 35.3 g/dL (ref 30.0–36.0)
MCV: 92.5 fL (ref 78.0–100.0)
PLATELETS: 158 10*3/uL (ref 150–400)
RBC: 4.01 MIL/uL (ref 3.87–5.11)
RDW: 13 % (ref 11.5–15.5)
WBC: 6.1 10*3/uL (ref 4.0–10.5)

## 2015-01-14 LAB — TROPONIN I: Troponin I: <0.03 ng/mL (ref <0.031)

## 2015-01-14 LAB — HEMOGLOBIN A1C
HEMOGLOBIN A1C: 5.8 % — AB (ref 4.8–5.6)
MEAN PLASMA GLUCOSE: 120 mg/dL

## 2015-01-14 LAB — HEPARIN LEVEL (UNFRACTIONATED)
HEPARIN UNFRACTIONATED: 0.47 [IU]/mL (ref 0.30–0.70)
Heparin Unfractionated: 0.52 IU/mL (ref 0.30–0.70)

## 2015-01-14 LAB — PROTIME-INR
INR: 1.17 (ref 0.00–1.49)
Prothrombin Time: 15.1 seconds (ref 11.6–15.2)

## 2015-01-14 NOTE — Care Management Note (Addendum)
Case Management Note  Patient Details  Name: Virginia Miles MRN: [id number] Date of Birth: Jun 04, 1951  Subjective/Objective:    Pt admitted for STEMI                Action/Plan:  Pt is independent traveling from [place] visiting son in [place]. Pt is planning to stay in [facility] for approximately 2 months post discharge.  Pt will benefit from a follow up post discharge appointment with area PCP.  Pt is without insurance while traveling. CM will arrange appointment with [facility] for follow up appointment,   Expected Discharge Date:  01/15/15               Expected Discharge Plan:  Home/Self Care  In-House Referral:     Discharge planning Services  CM Consult, Indigent Health Clinic  Post Acute Care Choice:    Choice offered to:     DME Arranged:    DME Agency:     HH Arranged:    HH Agency:     Status of Service:  Completed, signed off  Medicare Important Message Given:    Date Medicare IM Given:    Medicare IM give by:    Date Additional Medicare IM Given:    Additional Medicare Important Message give by:     If discussed at Long Length of Stay Meetings, dates discussed:    Additional Comments: CM assessed pt with help from son as pt is non english speaking.  Pts son agreed to [facility] setting up appointment with [facility].  CM was able to get appointment for 01/22/15 at 3:30pm [facility] [address] in [place].  CM informed pts son and son explained to pt that pt will be able to get prescriptions filled at clinic immediatly post discharge.  Cherylann Parr, RN 01/14/2015, 2:42 PM

## 2015-01-14 NOTE — Progress Notes (Signed)
  Echocardiogram 2D Echocardiogram has been performed.  Virginia Miles M 01/14/2015, 2:05 PM

## 2015-01-14 NOTE — Progress Notes (Signed)
[facility] PROGRESS NOTE  Virginia Miles ZOX:[id number] DOB: 1950/09/02 DOA: 01/13/2015 PCP: No PCP Per Patient  Assessment/Plan:  NSTEMI (non-ST elevated myocardial infarction) Initially presented with an elevated trop and cardiology was consulted Serial trop has remained neg and pt denies chest pains -Cardiology consulted. -2d echo done with grade 2 diastolic dysfunction , otherwise no WMA  Active Problems:  Dyspnea secondary to Reactive airway disease with acute exacerbation Patient reports history of bronchitis and having similar symptoms about 10 months back in [place]. Reportedly was wheezy upon presentation. Also has nonproductive cough. Possibly has acute bronchitis symptoms. No signs of volume overload -Pt has been started on course of Z-Pak, or prednisone and when necessary albuterol nebs.   Essential hypertension On Benicar at home. Monitor on beta blocker for now.  Incidental finding of left adrenal nodule 1516 mm on CT scan No Constitutional symptoms. Recommend follow-up CT versus MRI in one year.     Obesity Needs counseling on diet monitoring and weight loss. Check lipid panel and A1c.  Code Status: Full Family Communication: Pt in room (indicate person spoken with, relationship, and if by phone, the number) Disposition Plan: Pending   Consultants:  Cardiology  Procedures:    Antibiotics:  Z-pak (indicate start date, and stop date if known)  HPI/Subjective: No complaints today. Reports feeling well  Objective: Filed Vitals:   01/13/15 2359 01/14/15 0546 01/14/15 0757 01/14/15 1505  BP:  129/62  132/61  Pulse: 72 66  65  Temp:  97.9 F (36.6 C)  98.3 F (36.8 C)  TempSrc:  Oral  Oral  Resp: 18 18  20   Height:      Weight:      SpO2: 99% 97% 97% 97%    Intake/Output Summary (Last 24 hours) at 01/14/15 1820 Last data filed at 01/14/15 1150  Gross per 24 hour  Intake    480 ml  Output      0 ml  Net    480 ml   Filed  Weights   01/13/15 0333 01/13/15 0349 01/13/15 1535  Weight: 101.606 kg (224 lb) 101.606 kg (224 lb) 100.6 kg (221 lb 12.5 oz)    Exam:   General:  Awake, in nad  Cardiovascular: regular, s1, s2  Respiratory: normal resp effort, no wheezing  Abdomen: soft, nondistended  Musculoskeletal: perfused, no clubbing   Data Reviewed: Basic Metabolic Panel:  Recent Labs Lab 01/13/15 0051 01/14/15 0330  NA 140 137  K 3.6 3.6  CL 102 104  CO2 27 25  GLUCOSE 133* 177*  BUN 13 14  CREATININE 0.71 0.73  CALCIUM 8.9 8.6*   Liver Function Tests:  Recent Labs Lab 01/13/15 0051  AST 25  ALT 20  ALKPHOS 63  BILITOT 0.5  PROT 7.5  ALBUMIN 3.2*   No results for input(s): LIPASE, AMYLASE in the last 168 hours. No results for input(s): AMMONIA in the last 168 hours. CBC:  Recent Labs Lab 01/13/15 0051 01/13/15 1223 01/14/15 0330  WBC 7.9 6.3 6.1  NEUTROABS 5.0  --   --   HGB 12.9 13.4 13.1  HCT 37.9 38.5 37.1  MCV 93.3 93.9 92.5  PLT 184 165 158   Cardiac Enzymes:  Recent Labs Lab 01/13/15 0240 01/13/15 1631 01/13/15 2045 01/14/15 0158 01/14/15 0330  TROPONINI <0.03 <0.03 <0.03 <0.03 <0.03   BNP (last 3 results)  Recent Labs  01/13/15 0051 01/13/15 1631  BNP 59.4 82.2    ProBNP (last 3 results) No results for input(s):  PROBNP in the last 8760 hours.  CBG: No results for input(s): GLUCAP in the last 168 hours.  No results found for this or any previous visit (from the past 240 hour(s)).   Studies: Dg Chest 2 View  01/13/2015   CLINICAL DATA:  Shortness of breath and cough  EXAM: CHEST  2 VIEW  COMPARISON:  None.  FINDINGS: There is no edema or consolidation. Heart size and pulmonary vascularity are normal. No adenopathy. There is degenerative change in the thoracic spine.  IMPRESSION: No edema or consolidation.   Electronically Signed   By: Bretta Bang III M.D.   On: 01/13/2015 02:38   Ct Angio Chest Pe W/cm &/or Wo Cm  01/13/2015   CLINICAL  DATA:  Shortness of breath with exertion. History of hypertension. Assess for pulmonary embolism.  EXAM: CT ANGIOGRAPHY CHEST WITH CONTRAST  TECHNIQUE: Multidetector CT imaging of the chest was performed using the standard protocol during bolus administration of intravenous contrast. Multiplanar CT image reconstructions and MIPs were obtained to evaluate the vascular anatomy.  CONTRAST:  OMNIPAQUE IOHEXOL 350 MG/ML SOLN  COMPARISON:  Chest radiograph January 13, 2015 at 1:29 a.m.  FINDINGS: PULMONARY ARTERY: Adequate contrast opacification of the pulmonary artery's. Main pulmonary artery is not enlarged. No pulmonary arterial filling defects to the level of the subsegmental branches.  MEDIASTINUM: Heart is mildly enlarged, no RIGHT heart strain. No pericardial effusions. Delete the Thoracic aorta is normal course and caliber, 2 vessel arch is a normal variant, mild calcific atherosclerosis. RIGHT hilar lymphadenopathy measures up to 12 mm short axis, smaller LEFT hilar lymph nodes. LEFT peritracheal 11 mm short axis lymph node.  LUNGS: Tracheobronchial tree is patent, no pneumothorax. Pulmonary venous congestion. No pleural effusions, focal consolidations, pulmonary nodules or masses. Mild heterogeneous lung attenuation.  SOFT TISSUES AND OSSEOUS STRUCTURES: 16 x 15 mm LEFT adrenal nodule, 41 Hounsfield units. Visualized soft tissues and included osseous structures appear normal.  Review of the MIP images confirms the above findings.  IMPRESSION: No acute pulmonary embolism.  Mild cardiomegaly and pulmonary venous congestion.  Heterogeneous lung attenuation can be seen with small airway disease or pulmonary edema without focal consolidation.  15 x 16 mm LEFT adrenal nodule: Recommend follow-up CT versus MRI and 1 year.   Electronically Signed   By: Awilda Metro M.D.   On: 01/13/2015 05:48    Scheduled Meds: . aspirin  325 mg Oral Daily  . atorvastatin  40 mg Oral q1800  . azithromycin  250 mg Oral  Daily  . methylPREDNISolone (SOLU-MEDROL) injection  125 mg Intravenous Q6H  . metoprolol tartrate  25 mg Oral BID  . pantoprazole  80 mg Oral Daily  . sodium chloride  3 mL Intravenous Q12H  . sodium chloride  3 mL Intravenous Q12H   Continuous Infusions:   Principal Problem:   NSTEMI (non-ST elevated myocardial infarction) Active Problems:   Wheezing   Reactive airway disease with acute exacerbation   Dyspnea   Essential hypertension   Obesity   Pain in the chest   CHIU, STEPHEN K  [facility] Pager [phone]. If 7PM-7AM, please contact night-coverage at [email or web address], password [facility] 01/14/2015, 6:20 PM  LOS: 1 day

## 2015-01-14 NOTE — Progress Notes (Signed)
ANTICOAGULATION CONSULT NOTE - Follow Up Consult  Pharmacy Consult for Heparin  Indication: chest pain/ACS  No Known Allergies  Patient Measurements: Height:  (149.9 cm) Weight: 221 lb 12.5 oz (100.6 kg) IBW/kg (Calculated) : 43.2  Vital Signs: Temp: 98.2 F (36.8 C) (08/08 2054) Temp Source: Oral (08/08 2054) BP: 135/63 mmHg (08/08 2054) Pulse Rate: 72 (08/08 2359)  Labs:  Recent Labs  01/13/15 0051  01/13/15 1223 01/13/15 1631 01/13/15 2045 01/14/15 0158 01/14/15 0330  HGB 12.9  --  13.4  --   --   --  13.1  HCT 37.9  --  38.5  --   --   --  37.1  PLT 184  --  165  --   --   --  158  LABPROT  --   --   --   --   --   --  15.1  INR  --   --   --   --   --   --  1.17  HEPARINUNFRC  --   --  0.11*  --  0.29*  --  0.52  CREATININE 0.71  --   --   --   --   --   --   TROPONINI  --   < >  --  <0.03 <0.03 <0.03  --   < > = values in this interval not displayed.  Estimated Creatinine Clearance: 75.2 mL/min (by C-G formula based on Cr of 0.71).  Assessment: Therapeutic heparin level after rate increase  Goal of Therapy:  Heparin level 0.3-0.7 units/ml Monitor platelets by anticoagulation protocol: Yes   Plan:  -Continue heparin at 1350 units/hr -1000 confirmatory HL  -Daily CBC/HL -Monitor for bleeding  Abran Duke 01/14/2015,4:12 AM

## 2015-01-14 NOTE — Progress Notes (Signed)
Patient Name: Virginia Miles Date of Encounter: 01/14/2015     Principal Problem:   NSTEMI (non-ST elevated myocardial infarction) Active Problems:   Wheezing   Reactive airway disease with acute exacerbation   Dyspnea   Essential hypertension   Obesity   Pain in the chest    SUBJECTIVE  Family indicates that she is feeling better today. Presently getting her 2D echo.  CURRENT MEDS . aspirin  325 mg Oral Daily  . atorvastatin  40 mg Oral q1800  . azithromycin  250 mg Oral Daily  . ipratropium-albuterol  3 mL Nebulization Q6H  . methylPREDNISolone (SOLU-MEDROL) injection  125 mg Intravenous Q6H  . metoprolol tartrate  25 mg Oral BID  . pantoprazole  80 mg Oral Daily  . sodium chloride  3 mL Intravenous Q12H  . sodium chloride  3 mL Intravenous Q12H    OBJECTIVE  Filed Vitals:   01/13/15 2054 01/13/15 2359 01/14/15 0546 01/14/15 0757  BP: 135/63  129/62   Pulse: 67 72 66   Temp: 98.2 F (36.8 C)  97.9 F (36.6 C)   TempSrc: Oral  Oral   Resp: Height:      Weight:      SpO2: 96% 99% 97% 97%    Intake/Output Summary (Last 24 hours) at 01/14/15 1332 Last data filed at 01/14/15 0810  Gross per 24 hour  Intake    240 ml  Output      0 ml  Net    240 ml   Filed Weights   01/13/15 0333 01/13/15 0349 01/13/15 1535  Weight: 224 lb (101.606 kg) 224 lb (101.606 kg) 221 lb 12.5 oz (100.6 kg)    PHYSICAL EXAM  General: Pleasant, NAD. Neuro: Alert and oriented X 3. Moves all extremities spontaneously. Psych: Normal affect. HEENT:  Normal  Neck: Supple without bruits or JVD. Lungs:  Resp regular and unlabored, CTA. Heart: RRR no s3, s4, or murmurs. Abdomen: Soft, non-tender, non-distended, BS + x 4.  Extremities: No clubbing, cyanosis or edema. DP/PT/Radials 2+ and equal bilaterally.  Accessory Clinical Findings  CBC  Recent Labs  01/13/15 0051 01/13/15 1223 01/14/15 0330  WBC 7.9 6.3 6.1  NEUTROABS 5.0  --   --   HGB 12.9 13.4 13.1    HCT 37.9 38.5 37.1  MCV 93.3 93.9 92.5  PLT 184 165 158   Basic Metabolic Panel  Recent Labs  01/13/15 0051 01/14/15 0330  NA 140 137  K 3.6 3.6  CL 102 104  CO2 27 25  GLUCOSE 133* 177*  BUN 13 14  CREATININE 0.71 0.73  CALCIUM 8.9 8.6*   Liver Function Tests  Recent Labs  01/13/15 0051  AST 25  ALT 20  ALKPHOS 63  BILITOT 0.5  PROT 7.5  ALBUMIN 3.2*   No results for input(s): LIPASE, AMYLASE in the last 72 hours. Cardiac Enzymes  Recent Labs  01/13/15 2045 01/14/15 0158 01/14/15 0330  TROPONINI <0.03 <0.03 <0.03   BNP Invalid input(s): POCBNP D-Dimer  Recent Labs  01/13/15 0051  DDIMER 1.80*   Hemoglobin A1C  Recent Labs  01/13/15 0838  HGBA1C 5.8*   Fasting Lipid Panel  Recent Labs  01/13/15 1631  CHOL 165  HDL 44  LDLCALC 106*  TRIG 76  CHOLHDL 3.8   Thyroid Function Tests  Recent Labs  01/13/15 1631  TSH 5.517*  4.818*    TELE  NSR  ECG  WNL  Radiology/Studies  Dg Chest  2 View  01/13/2015   CLINICAL DATA:  Shortness of breath and cough  EXAM: CHEST  2 VIEW  COMPARISON:  None.  FINDINGS: There is no edema or consolidation. Heart size and pulmonary vascularity are normal. No adenopathy. There is degenerative change in the thoracic spine.  IMPRESSION: No edema or consolidation.   Electronically Signed   By: Bretta Bang III M.D.   On: 01/13/2015 02:38   Ct Angio Chest Pe W/cm &/or Wo Cm  01/13/2015   CLINICAL DATA:  Shortness of breath with exertion. History of hypertension. Assess for pulmonary embolism.  EXAM: CT ANGIOGRAPHY CHEST WITH CONTRAST  TECHNIQUE: Multidetector CT imaging of the chest was performed using the standard protocol during bolus administration of intravenous contrast. Multiplanar CT image reconstructions and MIPs were obtained to evaluate the vascular anatomy.  CONTRAST:  OMNIPAQUE IOHEXOL 350 MG/ML SOLN  COMPARISON:  Chest radiograph January 13, 2015 at 1:29 a.m.  FINDINGS: PULMONARY ARTERY:  Adequate contrast opacification of the pulmonary artery's. Main pulmonary artery is not enlarged. No pulmonary arterial filling defects to the level of the subsegmental branches.  MEDIASTINUM: Heart is mildly enlarged, no RIGHT heart strain. No pericardial effusions. Delete the Thoracic aorta is normal course and caliber, 2 vessel arch is a normal variant, mild calcific atherosclerosis. RIGHT hilar lymphadenopathy measures up to 12 mm short axis, smaller LEFT hilar lymph nodes. LEFT peritracheal 11 mm short axis lymph node.  LUNGS: Tracheobronchial tree is patent, no pneumothorax. Pulmonary venous congestion. No pleural effusions, focal consolidations, pulmonary nodules or masses. Mild heterogeneous lung attenuation.  SOFT TISSUES AND OSSEOUS STRUCTURES: 16 x 15 mm LEFT adrenal nodule, 41 Hounsfield units. Visualized soft tissues and included osseous structures appear normal.  Review of the MIP images confirms the above findings.  IMPRESSION: No acute pulmonary embolism.  Mild cardiomegaly and pulmonary venous congestion.  Heterogeneous lung attenuation can be seen with small airway disease or pulmonary edema without focal consolidation.  15 x 16 mm LEFT adrenal nodule: Recommend follow-up CT versus MRI and 1 year.   Electronically Signed   By: Awilda Metro M.D.   On: 01/13/2015 05:48    ASSESSMENT AND PLAN  Atypical chest pain. EKG and troponins negative x3. I believe the initial troponin was spurious.  Plan: Will DC IV heparin now. If echo is satisfactory she can probably be discharged home later today.  Signed, Ronny Flurry MD

## 2015-01-14 NOTE — Progress Notes (Signed)
ANTICOAGULATION CONSULT NOTE - Follow Up Consult  Pharmacy Consult for heparin Indication: chest pain/ACS  No Known Allergies  Patient Measurements: Height:  (149.9 cm) Weight: 221 lb 12.5 oz (100.6 kg) IBW/kg (Calculated) : 43.2 Heparin Dosing Weight: 70.8 kg  Vital Signs: Temp: 97.9 F (36.6 C) (08/09 0546) Temp Source: Oral (08/09 0546) BP: 129/62 mmHg (08/09 0546) Pulse Rate: 66 (08/09 0546)  Labs:  Recent Labs  01/13/15 0051  01/13/15 1223  01/13/15 2045 01/14/15 0158 01/14/15 0330 01/14/15 0922  HGB 12.9  --  13.4  --   --   --  13.1  --   HCT 37.9  --  38.5  --   --   --  37.1  --   PLT 184  --  165  --   --   --  158  --   LABPROT  --   --   --   --   --   --  15.1  --   INR  --   --   --   --   --   --  1.17  --   HEPARINUNFRC  --   < > 0.11*  --  0.29*  --  0.52 0.47  CREATININE 0.71  --   --   --   --   --  0.73  --   TROPONINI  --   < >  --   < > <0.03 <0.03 <0.03  --   < > = values in this interval not displayed.  Estimated Creatinine Clearance: 75.2 mL/min (by C-G formula based on Cr of 0.73).  Medications:  Scheduled:  . aspirin  325 mg Oral Daily  . atorvastatin  40 mg Oral q1800  . azithromycin  250 mg Oral Daily  . ipratropium-albuterol  3 mL Nebulization Q6H  . methylPREDNISolone (SOLU-MEDROL) injection  125 mg Intravenous Q6H  . metoprolol tartrate  25 mg Oral BID  . pantoprazole  80 mg Oral Daily  . sodium chloride  3 mL Intravenous Q12H  . sodium chloride  3 mL Intravenous Q12H   Infusions:  . heparin 1,350 Units/hr (01/14/15 0053)    Assessment: 64 yo female presenting with SOB x 2 days  PMH: Chronic bronchitis, HTN  AC: on heparin for ACS. On 1350 units/hr with HL of 0.47 (therapeutic x 2)  Heme: CBC WNL  Goal of Therapy:  Heparin level 0.3-0.7 units/ml Monitor platelets by anticoagulation protocol: Yes   Plan:  Continue 1350 units/hr Daily CBC, HL F/U Duration of heparin  Isaac Bliss, PharmD, BCPS Clinical  Pharmacist Pager [phone] 01/14/2015 10:46 AM

## 2015-01-15 DIAGNOSIS — I10 Essential (primary) hypertension: Secondary | ICD-10-CM

## 2015-01-15 DIAGNOSIS — E278 Other specified disorders of adrenal gland: Secondary | ICD-10-CM | POA: Diagnosis present

## 2015-01-15 DIAGNOSIS — J45901 Unspecified asthma with (acute) exacerbation: Secondary | ICD-10-CM

## 2015-01-15 DIAGNOSIS — I214 Non-ST elevation (NSTEMI) myocardial infarction: Secondary | ICD-10-CM | POA: Diagnosis present

## 2015-01-15 DIAGNOSIS — E279 Disorder of adrenal gland, unspecified: Secondary | ICD-10-CM

## 2015-01-15 MED ORDER — ALBUTEROL SULFATE HFA 108 (90 BASE) MCG/ACT IN AERS: 1.0000 | INHALATION_SPRAY | RESPIRATORY_TRACT | Status: AC | PRN

## 2015-01-15 MED ORDER — PREDNISONE 20 MG PO TABS: 10.0000 mg | ORAL_TABLET | Freq: Every day | ORAL | Status: AC

## 2015-01-15 MED ORDER — PREDNISONE 20 MG PO TABS
40.0000 mg | ORAL_TABLET | Freq: Every day | ORAL | Status: DC
  Administered 2015-01-15: 40 mg via ORAL
  Filled 2015-01-15 (×2): qty 2

## 2015-01-15 NOTE — Progress Notes (Signed)
Patient Name: Virginia Miles Date of Encounter: 01/15/2015  Active Problems:   Wheezing   Reactive airway disease with acute exacerbation   Dyspnea   Essential hypertension   Obesity   Pain in the chest   Adrenal nodule   NSTEMI (non-ST elevated myocardial infarction)    SUBJECTIVE  Denies chest pain, sob or palpitations. She did not speak english. Daughter-in-law as interpreter. Discharge later today.   CURRENT MEDS . aspirin  325 mg Oral Daily  . atorvastatin  40 mg Oral q1800  . azithromycin  250 mg Oral Daily  . pantoprazole  80 mg Oral Daily  . predniSONE  40 mg Oral Q breakfast  . sodium chloride  3 mL Intravenous Q12H  . sodium chloride  3 mL Intravenous Q12H    OBJECTIVE  Filed Vitals:   01/14/15 0546 01/14/15 0757 01/14/15 1505 01/15/15 0641  BP: 129/62  132/61 137/67  Pulse: 66  65 54  Temp: 97.9 F (36.6 C)  98.3 F (36.8 C) 97.6 F (36.4 C)  TempSrc: Oral  Oral Oral  Resp: Height:      Weight:      SpO2: 97% 97% 97% 97%    Intake/Output Summary (Last 24 hours) at 01/15/15 0958 Last data filed at 01/14/15 1630  Gross per 24 hour  Intake    480 ml  Output      0 ml  Net    480 ml   Filed Weights   01/13/15 0333 01/13/15 0349 01/13/15 1535  Weight: 224 lb (101.606 kg) 224 lb (101.606 kg) 221 lb 12.5 oz (100.6 kg)    PHYSICAL EXAM  General: Pleasant, NAD. Neuro: Alert and oriented X 3. Moves all extremities spontaneously. Psych: Normal affect. HEENT:  Normal  Neck: Supple without bruits or JVD. Lungs:  Resp regular and unlabored, CTA. Heart: RRR no s3, s4, or murmurs. Abdomen: Soft, non-tender, non-distended, BS + x 4.  Extremities: No clubbing, cyanosis or edema. DP/PT/Radials 2+ and equal bilaterally.  Accessory Clinical Findings  CBC  Recent Labs  01/13/15 0051 01/13/15 1223 01/14/15 0330  WBC 7.9 6.3 6.1  NEUTROABS 5.0  --   --   HGB 12.9 13.4 13.1  HCT 37.9 38.5 37.1  MCV 93.3 93.9 92.5  PLT 184 165 158     Basic Metabolic Panel  Recent Labs  01/13/15 0051 01/14/15 0330  NA 140 137  K 3.6 3.6  CL 102 104  CO2 27 25  GLUCOSE 133* 177*  BUN 13 14  CREATININE 0.71 0.73  CALCIUM 8.9 8.6*   Liver Function Tests  Recent Labs  01/13/15 0051  AST 25  ALT 20  ALKPHOS 63  BILITOT 0.5  PROT 7.5  ALBUMIN 3.2*   Cardiac Enzymes  Recent Labs  01/13/15 2045 01/14/15 0158 01/14/15 0330  TROPONINI <0.03 <0.03 <0.03   BNP Invalid input(s): POCBNP D-Dimer  Recent Labs  01/13/15 0051  DDIMER 1.80*   Hemoglobin A1C  Recent Labs  01/13/15 0838  HGBA1C 5.8*   Fasting Lipid Panel  Recent Labs  01/13/15 1631  CHOL 165  HDL 44  LDLCALC 106*  TRIG 76  CHOLHDL 3.8   Thyroid Function Tests  Recent Labs  01/13/15 1631  TSH 5.517*  4.818*    TELE  NR at rate of 50s.   Radiology/Studies  Dg Chest 2 View  01/13/2015   CLINICAL DATA:  Shortness of breath and cough  EXAM: CHEST  2 VIEW  COMPARISON:  None.  FINDINGS: There is no edema or consolidation. Heart size and pulmonary vascularity are normal. No adenopathy. There is degenerative change in the thoracic spine.  IMPRESSION: No edema or consolidation.   Electronically Signed   By: Bretta Bang III M.D.   On: 01/13/2015 02:38   Ct Angio Chest Pe W/cm &/or Wo Cm  01/13/2015   CLINICAL DATA:  Shortness of breath with exertion. History of hypertension. Assess for pulmonary embolism.  EXAM: CT ANGIOGRAPHY CHEST WITH CONTRAST  TECHNIQUE: Multidetector CT imaging of the chest was performed using the standard protocol during bolus administration of intravenous contrast. Multiplanar CT image reconstructions and MIPs were obtained to evaluate the vascular anatomy.  CONTRAST:  OMNIPAQUE IOHEXOL 350 MG/ML SOLN  COMPARISON:  Chest radiograph January 13, 2015 at 1:29 a.m.  FINDINGS: PULMONARY ARTERY: Adequate contrast opacification of the pulmonary artery's. Main pulmonary artery is not enlarged. No pulmonary arterial  filling defects to the level of the subsegmental branches.  MEDIASTINUM: Heart is mildly enlarged, no RIGHT heart strain. No pericardial effusions. Delete the Thoracic aorta is normal course and caliber, 2 vessel arch is a normal variant, mild calcific atherosclerosis. RIGHT hilar lymphadenopathy measures up to 12 mm short axis, smaller LEFT hilar lymph nodes. LEFT peritracheal 11 mm short axis lymph node.  LUNGS: Tracheobronchial tree is patent, no pneumothorax. Pulmonary venous congestion. No pleural effusions, focal consolidations, pulmonary nodules or masses. Mild heterogeneous lung attenuation.  SOFT TISSUES AND OSSEOUS STRUCTURES: 16 x 15 mm LEFT adrenal nodule, 41 Hounsfield units. Visualized soft tissues and included osseous structures appear normal.  Review of the MIP images confirms the above findings.  IMPRESSION: No acute pulmonary embolism.  Mild cardiomegaly and pulmonary venous congestion.  Heterogeneous lung attenuation can be seen with small airway disease or pulmonary edema without focal consolidation.  15 x 16 mm LEFT adrenal nodule: Recommend follow-up CT versus MRI and 1 year.   Electronically Signed   By: Awilda Metro M.D.   On: 01/13/2015 05:48   Echo 01/14/15 LV EF: 60% -  65%  ------------------------------------------------------------------- Indications:   Dyspnea 786.09.  ------------------------------------------------------------------- History:  PMH: Bronchitis. Dyspnea. Risk factors: Hypertension.  ------------------------------------------------------------------- Study Conclusions  - Left ventricle: The cavity size was normal. Systolic function was normal. The estimated ejection fraction was in the range of 60% to 65%. Wall motion was normal; there were no regional wall motion abnormalities. Features are consistent with a pseudonormal left ventricular filling pattern, with concomitant abnormal relaxation and increased filling pressure (grade  2 diastolic dysfunction). - Aortic valve: There was mild regurgitation. - Tricuspid valve: There was trivial regurgitation. - Pulmonary arteries: PA peak pressure: 31 mm Hg (S).  ASSESSMENT AND PLAN  1. Atypical chest pain. EKG and troponins negative x3. Initial troponin was spurious. Discontinued IV heparin. Echo showed LV EF of 60-65%, no WM abnormality, grade 2DD, PA peak pressure of 31 mm Hg. No sign of volume overload. Will discuss with MD if she need any medical therapy.  - LDL of 106, HgbA1c of 5.8. - High TSH and Free T4 - Could be a reason of chest pain, need further evaluation - as per primary.  2. HTN: Stable,  continue current regimen.  3. Incidental finding of left adrenal nodule 1516 mm on CT scan: per primary  4. High TSH and Free T4: Further evaluation per primary    Dispo: 4-6 weeks hospital f/u with cardiology. Appointment has been made.   Lorelei Pont PA-C Pager [phone] Agree with  above assessment.  She does not need any additional medications from the cardiac standpoint. Lungs are clear. She does not have any further chest pain or other pain. Okay for discharge today from cardiac standpoint.

## 2015-01-17 NOTE — Discharge Summary (Signed)
Physician Discharge Summary  Virginia Miles ZOX:[id number] DOB: 1950-11-02 DOA: 01/13/2015  PCP: No PCP Per Patient  Admit date: 01/13/2015 Discharge date: 01/15/2015  Time spent: 45 minutes  Recommendations for Outpatient Follow-up:  1. PCP at [facility] wellness clinic in 1 week, please assess need for diuretics on FU 2. Adrenal nodule needs FU imaging Recommend follow-up CT versus MRI in one year.  Discharge Diagnoses:  Active Problems:   Wheezing   Reactive airway disease with acute exacerbation   Dyspnea   Essential hypertension   Obesity   Pain in the chest   Adrenal nodule   Discharge Condition: stable  Diet recommendation: low sodium  Filed Weights   01/13/15 0333 01/13/15 0349 01/13/15 1535  Weight: 101.606 kg (224 lb) 101.606 kg (224 lb) 100.6 kg (221 lb 12.5 oz)    History of present illness:  Chief complaint: Shortness of breath for past 2 days HPI: 64 year old obese female with history of Chronic bronchitis, hypertension who came to the [place] about 2 weeks ago from [place] to visit family presented to the ED with shortness of breath for the past 2 days. Patient reported having acute onset of shortness of breath worsened with activity and then became progressive making her dyspneic at rest associated with orthopnea. Reports having similar symptoms about 10 months back and was hospitalized in [place] and was told that she had bronchitis symptoms.   Hospital Course:  Elevated troponin -spurious lab, repeat troponin's were normal -denied chest pains -Cardiology consulted, -2d echo done with grade 2 diastolic dysfunction, no wall motion abnormality  Dyspnea secondary to Reactive airway disease with acute exacerbation -Patient reports history of bronchitis and having similar symptoms about 10 months back in [place]. -she was wheezing on admission, improved with Abx, nebs and steroids -No signs of volume overload, has grade 2 diastolic dysfunction on ECHO, advised  salt restriction, may need diuretics in the future  Essential hypertension -stable  Incidental finding of left adrenal nodule 1516 mm on CT scan No Constitutional symptoms. Recommend follow-up CT versus MRI in one year.     Discharge Exam: Filed Vitals:   01/15/15 1340  BP: 122/51  Pulse: 58  Temp: 97.8 F (36.6 C)  Resp: 20    General: AAOx3 Cardiovascular: S1S2/RRR Respiratory: CTAB  Discharge Instructions   Discharge Instructions    Diet - low sodium heart healthy    Complete by:  As directed      Increase activity slowly    Complete by:  As directed           Discharge Medication List as of 01/15/2015  2:59 PM    START taking these medications   Details  predniSONE (DELTASONE) 20 MG tablet Take 0.5-2 tablets (10-40 mg total) by mouth daily with breakfast. Take 40mg  for 2days then 20mg  for 2days then 10mg  for 1 day then STOP, Starting 01/15/2015, Until Discontinued, Print      CONTINUE these medications which have CHANGED   Details  albuterol (PROVENTIL HFA;VENTOLIN HFA) 108 (90 BASE) MCG/ACT inhaler Inhale 1-2 puffs into the lungs every 4 (four) hours as needed for wheezing or shortness of breath., Starting 01/15/2015, Until Discontinued, Print      CONTINUE these medications which have NOT CHANGED   Details  omeprazole (PRILOSEC) 40 MG capsule Take 40 mg by mouth 2 (two) times daily., Until Discontinued, Historical Med       No Known Allergies Follow-up Information    Follow up with [facility] On 01/22/2015.   Why:  at 3:30pm   Contact information:   [address] [place] [address] [phone]      Follow up with FU CT scan of  Abdomen to FU on Adrenal nodule .   Why:  in 1 year      Follow up with [facility] R, NP On 02/12/2015.   Specialties:  Cardiology, Radiology   Why:  @10 :15 post hospital cardiology   Contact information:   [address] [place] [address] [phone]         The results of significant diagnostics from this hospitalization (including imaging, microbiology, ancillary and laboratory) are listed below for reference.    Significant Diagnostic Studies: Dg Chest 2 View  01/13/2015   CLINICAL DATA:  Shortness of breath and cough  EXAM: CHEST  2 VIEW  COMPARISON:  None.  FINDINGS: There is no edema or consolidation. Heart size and pulmonary vascularity are normal. No adenopathy. There is degenerative change in the thoracic spine.  IMPRESSION: No edema or consolidation.   Electronically Signed   By: Bretta Bang III M.D.   On: 01/13/2015 02:38   Ct Angio Chest Pe W/cm &/or Wo Cm  01/13/2015   CLINICAL DATA:  Shortness of breath with exertion. History of hypertension. Assess for pulmonary embolism.  EXAM: CT ANGIOGRAPHY CHEST WITH CONTRAST  TECHNIQUE: Multidetector CT imaging of the chest was performed using the standard protocol during bolus administration of intravenous contrast. Multiplanar CT image reconstructions and MIPs were obtained to evaluate the vascular anatomy.  CONTRAST:  OMNIPAQUE IOHEXOL 350 MG/ML SOLN  COMPARISON:  Chest radiograph January 13, 2015 at 1:29 a.m.  FINDINGS: PULMONARY ARTERY: Adequate contrast opacification of the pulmonary artery's. Main pulmonary artery is not enlarged. No pulmonary arterial filling defects to the level of the subsegmental branches.  MEDIASTINUM: Heart is mildly enlarged, no RIGHT heart strain. No pericardial effusions. Delete the Thoracic aorta is normal course and caliber, 2 vessel arch is a normal variant, mild calcific atherosclerosis. RIGHT hilar lymphadenopathy measures up to 12 mm short axis, smaller LEFT hilar lymph nodes. LEFT peritracheal 11 mm short axis lymph node.  LUNGS: Tracheobronchial tree is patent, no pneumothorax. Pulmonary venous congestion. No pleural effusions, focal consolidations, pulmonary nodules or masses. Mild heterogeneous lung attenuation.  SOFT TISSUES AND OSSEOUS STRUCTURES: 16  x 15 mm LEFT adrenal nodule, 41 Hounsfield units. Visualized soft tissues and included osseous structures appear normal.  Review of the MIP images confirms the above findings.  IMPRESSION: No acute pulmonary embolism.  Mild cardiomegaly and pulmonary venous congestion.  Heterogeneous lung attenuation can be seen with small airway disease or pulmonary edema without focal consolidation.  15 x 16 mm LEFT adrenal nodule: Recommend follow-up CT versus MRI and 1 year.   Electronically Signed   By: Awilda Metro M.D.   On: 01/13/2015 05:48    Microbiology: No results found for this or any previous visit (from the past 240 hour(s)).   Labs: Basic Metabolic Panel:  Recent Labs Lab 01/13/15 0051 01/14/15 0330  NA 140 137  K 3.6 3.6  CL 102 104  CO2 27 25  GLUCOSE 133* 177*  BUN 13 14  CREATININE 0.71 0.73  CALCIUM 8.9 8.6*   Liver Function Tests:  Recent Labs Lab 01/13/15 0051  AST 25  ALT 20  ALKPHOS 63  BILITOT 0.5  PROT 7.5  ALBUMIN 3.2*   No results for input(s): LIPASE, AMYLASE in the last 168  hours. No results for input(s): AMMONIA in the last 168 hours. CBC:  Recent Labs Lab 01/13/15 0051 01/13/15 1223 01/14/15 0330  WBC 7.9 6.3 6.1  NEUTROABS 5.0  --   --   HGB 12.9 13.4 13.1  HCT 37.9 38.5 37.1  MCV 93.3 93.9 92.5  PLT 184 165 158   Cardiac Enzymes:  Recent Labs Lab 01/13/15 0240 01/13/15 1631 01/13/15 2045 01/14/15 0158 01/14/15 0330  TROPONINI <0.03 <0.03 <0.03 <0.03 <0.03   BNP: BNP (last 3 results)  Recent Labs  01/13/15 0051 01/13/15 1631  BNP 59.4 82.2    ProBNP (last 3 results) No results for input(s): PROBNP in the last 8760 hours.  CBG: No results for input(s): GLUCAP in the last 168 hours.     SignedZannie Cove  [facility] 01/17/2015, 3:43 PM

## 2015-01-22 ENCOUNTER — Encounter: Payer: Self-pay | Admitting: Family Medicine

## 2015-01-22 ENCOUNTER — Ambulatory Visit (INDEPENDENT_AMBULATORY_CARE_PROVIDER_SITE_OTHER): Payer: Self-pay | Admitting: Family Medicine

## 2015-01-22 VITALS — BP 154/63 | HR 98 | Temp 98.6°F | Resp 16 | Ht 59.0 in | Wt 238.0 lb

## 2015-01-22 DIAGNOSIS — K219 Gastro-esophageal reflux disease without esophagitis: Secondary | ICD-10-CM

## 2015-01-22 DIAGNOSIS — I517 Cardiomegaly: Secondary | ICD-10-CM

## 2015-01-22 DIAGNOSIS — J4520 Mild intermittent asthma, uncomplicated: Secondary | ICD-10-CM

## 2015-01-22 NOTE — Patient Instructions (Signed)
Follow-up with doctor in [place] about growth on adrenal gland and about thyroid. We are giving you a copy of your labs and your CT scan.

## 2015-01-22 NOTE — Progress Notes (Signed)
Patient ID: Virginia Miles, female   DOB: [birth year], 64 y.o.   MRN: [id number]   Virginia Miles, is a 64 y.o. female  [id number]  [id number]  DOB - May 25, 1951  CC: No chief complaint on file.      HPI: Virginia Miles is a 64 y.o. female here for follow-up of a recent hospitalization for shortness of breath. There has been a diagnosis of NSTEMI on her chart but as best as I can determine and according to what she tells me through and interpreter, she did not not have an MI. She has a history of hypertension and is on htcz and some other medication from [place] and has a supply sufficient to last until she returns home in late September. She reports her breathing is much better and she has not had to use her albuterol inhaler since discharge. A CT scan showed mild cardiomegaly and pulmonary congestion. There was also a adrenal nodule found which needs follow-up in a year. Her TSH was also elevated. She needs no refills. No Known Allergies Past Medical History  Diagnosis Date  . Hypertension    Current Outpatient Prescriptions on File Prior to Visit  Medication Sig Dispense Refill  . albuterol (PROVENTIL HFA;VENTOLIN HFA) 108 (90 BASE) MCG/ACT inhaler Inhale 1-2 puffs into the lungs every 4 (four) hours as needed for wheezing or shortness of breath. 1 Inhaler 0  . omeprazole (PRILOSEC) 40 MG capsule Take 40 mg by mouth 2 (two) times daily.    . predniSONE (DELTASONE) 20 MG tablet Take 0.5-2 tablets (10-40 mg total) by mouth daily with breakfast. Take  for 2days then  for 2days then  for 1 day then STOP 7 tablet 0   No current facility-administered medications on file prior to visit.   No family history on file. Social History   Social History  . Marital Status: Married    Spouse Name: N/A  . Number of Children: N/A  . Years of Education: N/A   Occupational History  . Not on file.   Social History Main Topics  . Smoking status: Never Smoker   . Smokeless  tobacco: Not on file  . Alcohol Use: No  . Drug Use: Not on file  . Sexual Activity: Not on file   Other Topics Concern  . Not on file   Social History Narrative  . No narrative on file    Review of Systems: Constitutional: Negative for fever, chills, appetite change, weight loss,  fatigue. Respiratory: Negative for cough, shortness of breath,   Cardiovascular: Negative for chest pain, palpitations and leg swelling. Gastrointestinal: Negative for abdominal distention, abdominal pain, nausea, vomiting, diarrhea, constipations Genitourinary: Negative for dysuria, urgency, frequency, hematuria, flank pain,  Neurological: Negative for  tremors, seizures, syncope,   light-headedness, numbness and headaches. Positive for occassional dizziness if she stands too quickly      Objective:  There were no vitals filed for this visit.  Physical Exam: Constitutional: Patient appears well-developed and well-nourished. No distress. HENT: Normocephalic, atraumatic, External right and left ear normal. Oropharynx is clear and moist.  Eyes: Conjunctivae and EOM are normal. PERRLA, no scleral icterus. Neck: Normal ROM. Neck supple. No lymphadenopathy, No thyromegaly. CVS: RRR, S1/S2 +, no murmurs, no gallops, no rubs Pulmonary: Effort and breath sounds normal, no stridor, rhonchi, wheezes, rales.  Abdominal: Soft. Normoactive BS,, no distension, tenderness, rebound or guarding.  Musculoskeletal: Normal range of motion. No edema and no tenderness.  Neuro: Alert.Normal muscle tone coordination. Non-focal Skin: Skin is warm  and dry. No rash noted. Not diaphoretic. No erythema. No pallor. Psychiatric: Normal mood and affect. Behavior, judgment, thought content normal.  Lab Results  Component Value Date   WBC 6.1 01/14/2015   HGB 13.1 01/14/2015   HCT 37.1 01/14/2015   MCV 92.5 01/14/2015   PLT 158 01/14/2015   Lab Results  Component Value Date   CREATININE 0.73 01/14/2015   BUN 14 01/14/2015    NA 137 01/14/2015   K 3.6 01/14/2015   CL 104 01/14/2015   CO2 25 01/14/2015    Lab Results  Component Value Date   HGBA1C 5.8* 01/13/2015   Lipid Panel     Component Value Date/Time   CHOL 165 01/13/2015 1631   TRIG 76 01/13/2015 1631   HDL 44 01/13/2015 1631   CHOLHDL 3.8 01/13/2015 1631   VLDL 15 01/13/2015 1631   LDLCALC 106* 01/13/2015 1631       Assessment and plan:   Hypertension -Continue current medicaions from [place]  -Reactive airway disease -Use albuterol if needed -ED if significant dyspnea  Adrenal nodule -We have given her copies of her CT scan to take home to her doctor  Elevated TSH -Copies of labs provided to give to home doctor   Follow-up prn while here.    Henrietta Hoover, FNP-BC   No Follow-up on file.  The patient was given clear instructions to go to ER or return to medical center if symptoms don't improve, worsen or new problems develop. The patient verbalized understanding.      Henrietta Hoover, MSN, FNP-BC   01/22/2015, 3:42 PM

## 2015-02-11 ENCOUNTER — Encounter: Payer: Self-pay | Admitting: *Deleted

## 2015-02-12 ENCOUNTER — Encounter: Payer: Self-pay | Admitting: Cardiology

## 2017-01-05 IMAGING — CT CT ANGIO CHEST
2 of 7 series · 18 of 46 positions shown · IV contrast (omnipaque)
Comparison: Chest radiograph January 13, 2015 at [DATE] a.m.

CLINICAL DATA: Shortness of breath with exertion. History of
hypertension. Assess for pulmonary embolism.

EXAM:
CT ANGIOGRAPHY CHEST WITH CONTRAST
TECHNIQUE: Multidetector CT imaging of the chest was performed using the
standard protocol during bolus administration of intravenous
contrast. Multiplanar CT image reconstructions and MIPs were
obtained to evaluate the vascular anatomy.
CONTRAST:  100mL OMNIPAQUE IOHEXOL 350 MG/ML SOLN

[Series 6: thins · axial · 0.59mm/px · z∈[+1180,+1372]mm · 15 of 212 slices shown]
[im 10/212  lung]
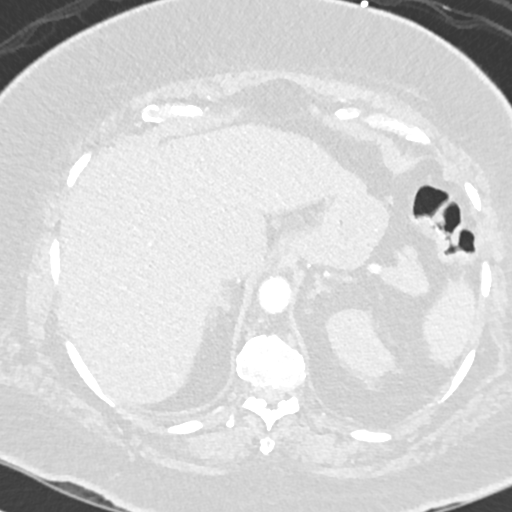
[im 28/212  soft-tissue]
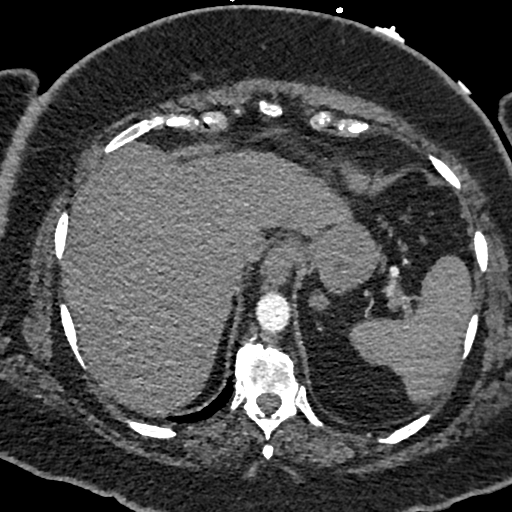
[im 37/212  lung]
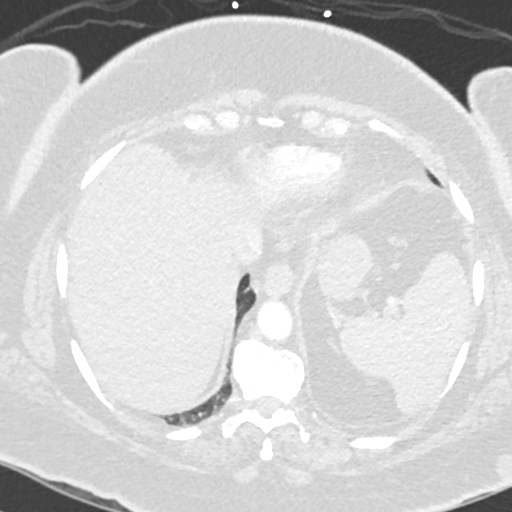
[im 56/212  soft-tissue]
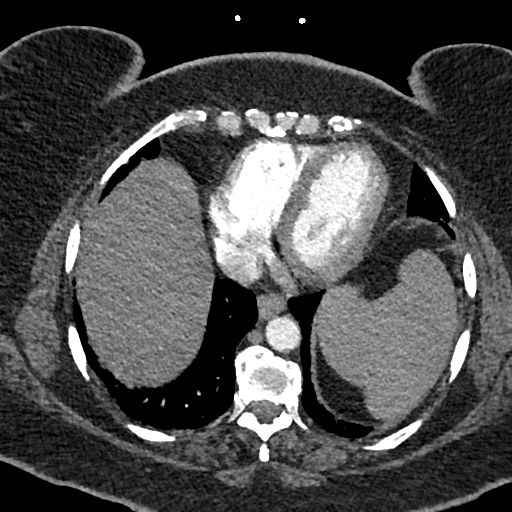
[im 65/212  lung]
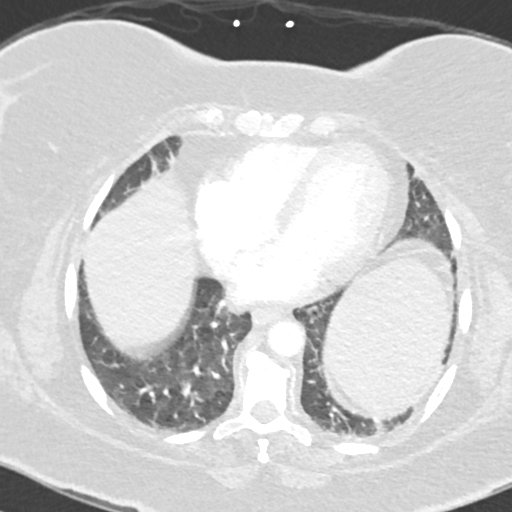
[im 83/212  soft-tissue]
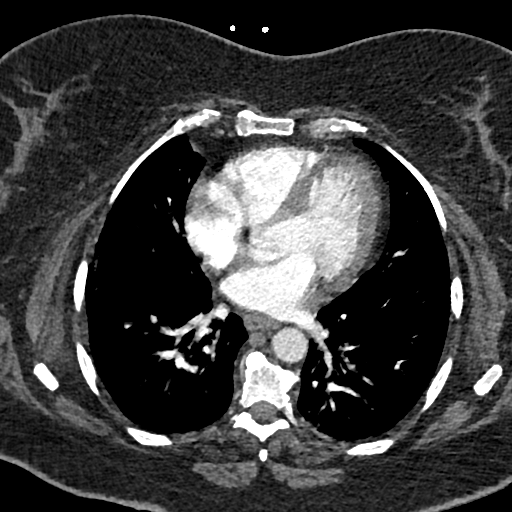
[im 92/212  lung]
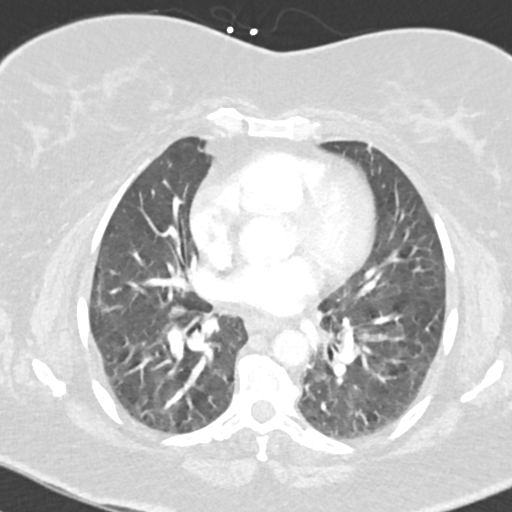
[im 111/212  soft-tissue]
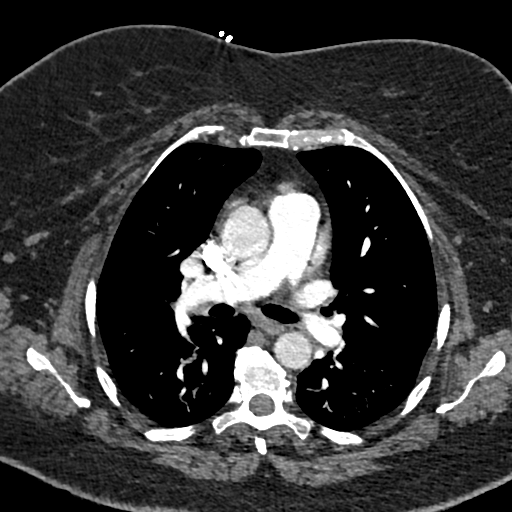
[im 120/212  lung]
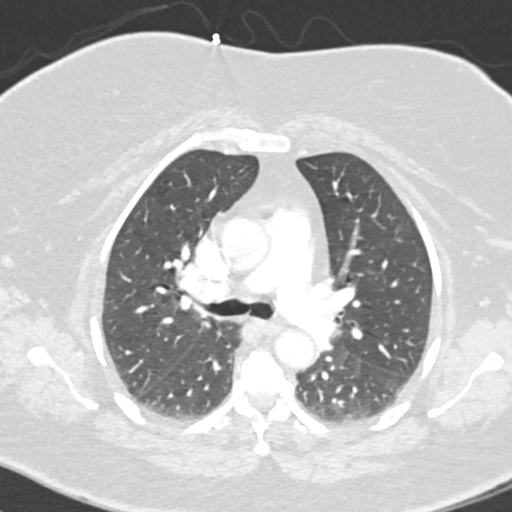
[im 129/212  soft-tissue]
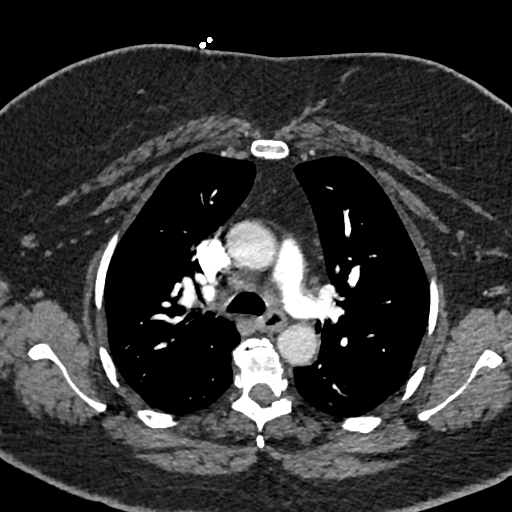
[im 147/212  lung]
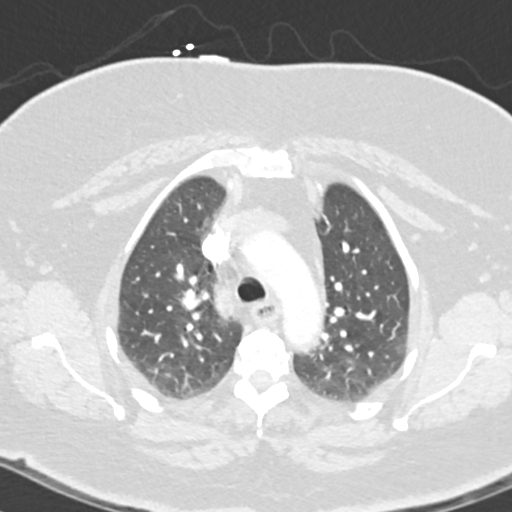
[im 156/212  soft-tissue]
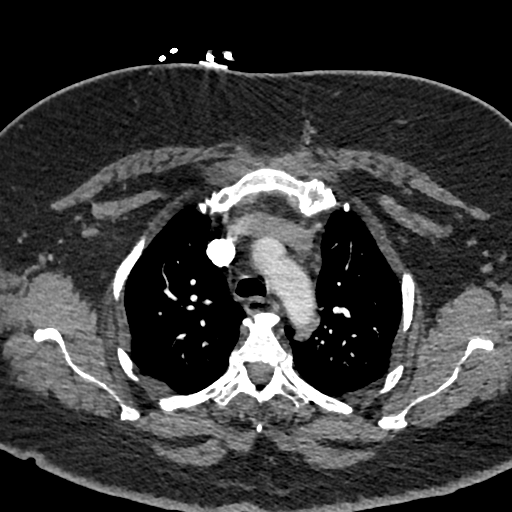
[im 175/212  lung]
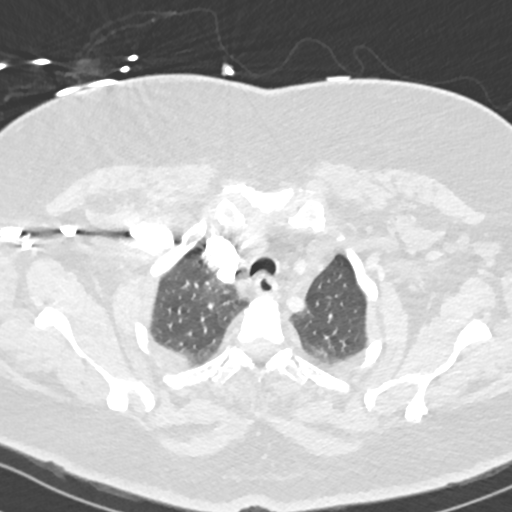
[im 184/212  soft-tissue]
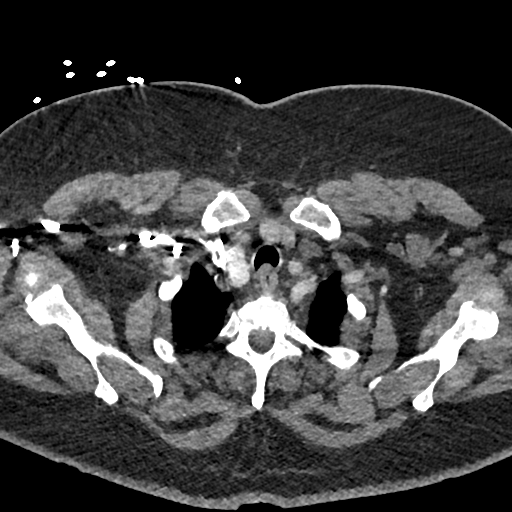
[im 202/212  lung]
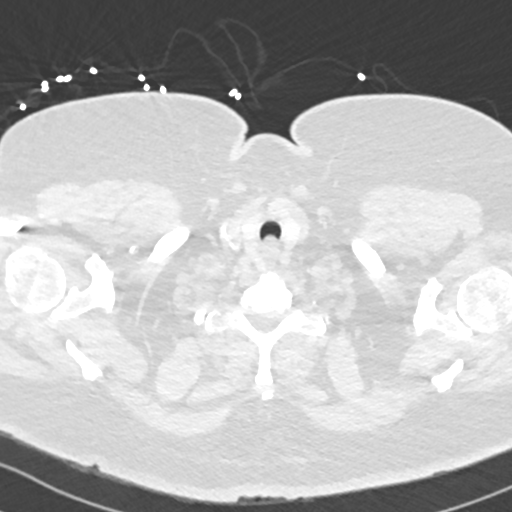

[Series 9: coronal mpr · coronal · 0.42mm/px · 3 of 119 slices shown]
[im 30/119  soft-tissue]
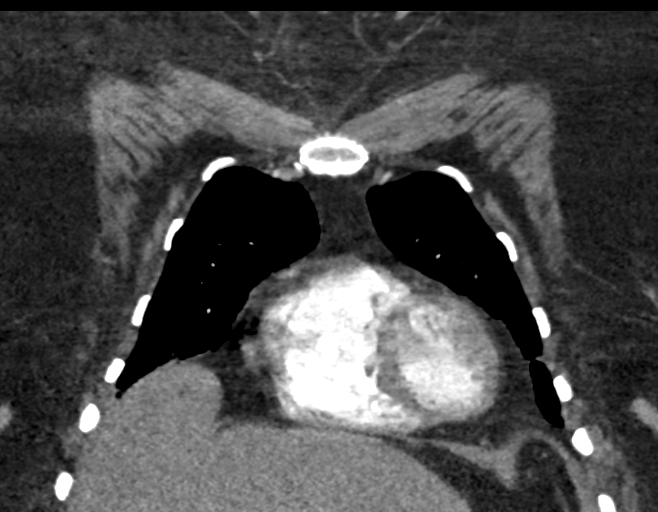
[im 60/119  soft-tissue]
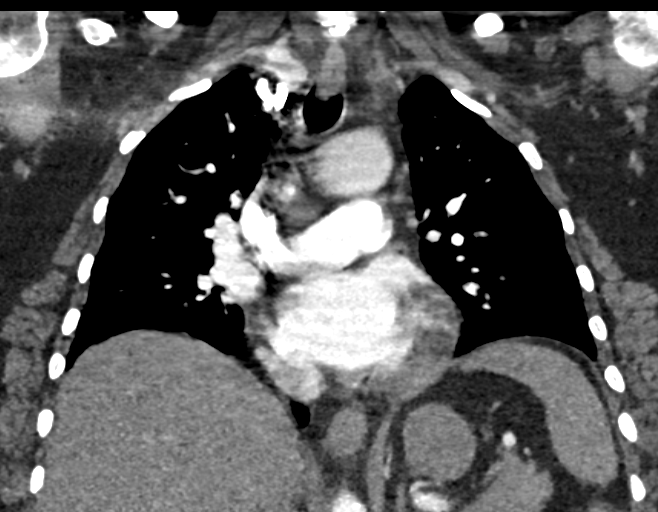
[im 89/119  soft-tissue]
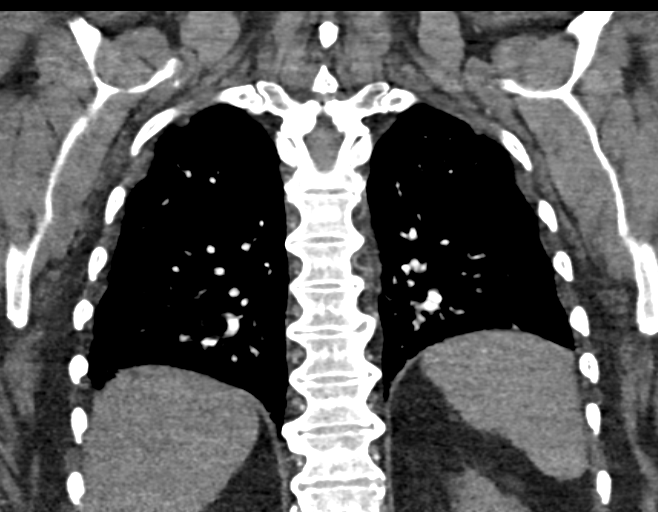

[18 of 46 positions shown; findings below may reference images not displayed]

FINDINGS: PULMONARY ARTERY: Adequate contrast opacification of the pulmonary
artery's. Main pulmonary artery is not enlarged. No pulmonary
arterial filling defects to the level of the subsegmental branches.

MEDIASTINUM: Heart is mildly enlarged, no RIGHT heart strain. No
pericardial effusions. Delete the Thoracic aorta is normal course
and caliber, 2 vessel arch is a normal variant, mild calcific
atherosclerosis. RIGHT hilar lymphadenopathy measures up to 12 mm
short axis, smaller LEFT hilar lymph nodes. LEFT peritracheal 11 mm
short axis lymph node.

LUNGS: Tracheobronchial tree is patent, no pneumothorax. Pulmonary
venous congestion. No pleural effusions, focal consolidations,
pulmonary nodules or masses. Mild heterogeneous lung attenuation.

SOFT TISSUES AND OSSEOUS STRUCTURES: 16 x 15 mm LEFT adrenal nodule,
41 Hounsfield units. Visualized soft tissues and included osseous
structures appear normal.

Review of the MIP images confirms the above findings.
IMPRESSION: No acute pulmonary embolism.

Mild cardiomegaly and pulmonary venous congestion.

Heterogeneous lung attenuation can be seen with small airway disease
or pulmonary edema without focal consolidation.

15 x 16 mm LEFT adrenal nodule: Recommend follow-up CT versus MRI
and 1 year.

## 2024-05-06 ENCOUNTER — Other Ambulatory Visit (HOSPITAL_COMMUNITY): Payer: Self-pay
